# Patient Record
Sex: Female | Born: 1960
Health system: Southern US, Community
[De-identification: ages and names within clinical notes are randomized; demographics above are authoritative.]

## PROBLEM LIST (undated history)

## (undated) DIAGNOSIS — I10 Essential (primary) hypertension: Secondary | ICD-10-CM

## (undated) DIAGNOSIS — K589 Irritable bowel syndrome without diarrhea: Secondary | ICD-10-CM

## (undated) DIAGNOSIS — M19072 Primary osteoarthritis, left ankle and foot: Secondary | ICD-10-CM

## (undated) DIAGNOSIS — M810 Age-related osteoporosis without current pathological fracture: Secondary | ICD-10-CM

## (undated) DIAGNOSIS — R918 Other nonspecific abnormal finding of lung field: Secondary | ICD-10-CM

## (undated) DIAGNOSIS — K7689 Other specified diseases of liver: Secondary | ICD-10-CM

## (undated) DIAGNOSIS — G4700 Insomnia, unspecified: Secondary | ICD-10-CM

## (undated) DIAGNOSIS — D802 Selective deficiency of immunoglobulin A [IgA]: Secondary | ICD-10-CM

## (undated) DIAGNOSIS — M19041 Primary osteoarthritis, right hand: Secondary | ICD-10-CM

## (undated) DIAGNOSIS — I7789 Other specified disorders of arteries and arterioles: Secondary | ICD-10-CM

## (undated) DIAGNOSIS — E559 Vitamin D deficiency, unspecified: Secondary | ICD-10-CM

## (undated) DIAGNOSIS — M2240 Chondromalacia patellae, unspecified knee: Secondary | ICD-10-CM

## (undated) DIAGNOSIS — R5383 Other fatigue: Secondary | ICD-10-CM

## (undated) DIAGNOSIS — M19042 Primary osteoarthritis, left hand: Secondary | ICD-10-CM

## (undated) DIAGNOSIS — M797 Fibromyalgia: Principal | ICD-10-CM

## (undated) DIAGNOSIS — M170 Bilateral primary osteoarthritis of knee: Secondary | ICD-10-CM

## (undated) DIAGNOSIS — M19071 Primary osteoarthritis, right ankle and foot: Secondary | ICD-10-CM

## (undated) DIAGNOSIS — R600 Localized edema: Secondary | ICD-10-CM

## (undated) HISTORY — DX: Vitamin D deficiency, unspecified: E55.9

## (undated) HISTORY — DX: Age-related osteoporosis without current pathological fracture: M81.0

## (undated) HISTORY — DX: Selective deficiency of immunoglobulin a (iga): D80.2

## (undated) HISTORY — DX: Other specified diseases of liver: K76.89

## (undated) HISTORY — DX: Insomnia, unspecified: G47.00

## (undated) HISTORY — DX: Primary osteoarthritis, left ankle and foot: M19.072

## (undated) HISTORY — DX: Other specified disorders of arteries and arterioles: I77.89

## (undated) HISTORY — DX: Primary osteoarthritis, left hand: M19.042

## (undated) HISTORY — DX: Primary osteoarthritis, right hand: M19.041

## (undated) HISTORY — DX: Other nonspecific abnormal finding of lung field: R91.8

## (undated) HISTORY — DX: Chondromalacia patellae, unspecified knee: M22.40

## (undated) HISTORY — DX: Localized edema: R60.0

## (undated) HISTORY — DX: Primary osteoarthritis, right ankle and foot: M19.071

## (undated) HISTORY — DX: Other fatigue: R53.83

## (undated) HISTORY — DX: Essential (primary) hypertension: I10

## (undated) HISTORY — PX: ABDOMINAL HYSTERECTOMY: SHX81

## (undated) HISTORY — DX: Bilateral primary osteoarthritis of knee: M17.0

## (undated) HISTORY — PX: AUGMENTATION MAMMAPLASTY: SUR837

## (undated) HISTORY — DX: Fibromyalgia: M79.7

## (undated) HISTORY — DX: Irritable bowel syndrome without diarrhea: K58.9

---

## 2002-11-18 DIAGNOSIS — K219 Gastro-esophageal reflux disease without esophagitis: Secondary | ICD-10-CM | POA: Insufficient documentation

## 2002-11-18 DIAGNOSIS — N200 Calculus of kidney: Secondary | ICD-10-CM | POA: Insufficient documentation

## 2005-05-20 DIAGNOSIS — B351 Tinea unguium: Secondary | ICD-10-CM | POA: Insufficient documentation

## 2005-06-12 DIAGNOSIS — R259 Unspecified abnormal involuntary movements: Secondary | ICD-10-CM | POA: Insufficient documentation

## 2007-03-04 DIAGNOSIS — L723 Sebaceous cyst: Secondary | ICD-10-CM | POA: Insufficient documentation

## 2008-07-15 DIAGNOSIS — R042 Hemoptysis: Secondary | ICD-10-CM | POA: Insufficient documentation

## 2008-07-15 DIAGNOSIS — J189 Pneumonia, unspecified organism: Secondary | ICD-10-CM | POA: Insufficient documentation

## 2008-11-23 DIAGNOSIS — J309 Allergic rhinitis, unspecified: Secondary | ICD-10-CM | POA: Insufficient documentation

## 2014-03-21 ENCOUNTER — Other Ambulatory Visit (HOSPITAL_COMMUNITY): Payer: Self-pay | Admitting: Family Medicine

## 2014-03-22 ENCOUNTER — Other Ambulatory Visit (HOSPITAL_COMMUNITY): Payer: Self-pay | Admitting: Family Medicine

## 2014-03-30 ENCOUNTER — Other Ambulatory Visit (HOSPITAL_COMMUNITY): Payer: Self-pay | Admitting: Family Medicine

## 2014-03-30 DIAGNOSIS — I719 Aortic aneurysm of unspecified site, without rupture: Secondary | ICD-10-CM

## 2014-04-25 ENCOUNTER — Telehealth: Payer: Self-pay | Admitting: *Deleted

## 2014-04-25 NOTE — Telephone Encounter (Signed)
patient stated that her referring doctor decided she did not need to be seen at [facility] after she had her CT Scan, she will f/u in a year with Dr Badger//cm

## 2014-04-26 ENCOUNTER — Encounter: Payer: Self-pay | Admitting: Surgery

## 2014-08-17 DIAGNOSIS — R0600 Dyspnea, unspecified: Secondary | ICD-10-CM | POA: Insufficient documentation

## 2014-10-11 DIAGNOSIS — R059 Cough, unspecified: Secondary | ICD-10-CM | POA: Insufficient documentation

## 2014-10-11 DIAGNOSIS — R05 Cough: Secondary | ICD-10-CM | POA: Insufficient documentation

## 2015-09-13 DIAGNOSIS — R062 Wheezing: Secondary | ICD-10-CM | POA: Insufficient documentation

## 2016-02-06 DIAGNOSIS — Z86010 Personal history of colonic polyps: Secondary | ICD-10-CM | POA: Insufficient documentation

## 2016-02-06 DIAGNOSIS — Z860101 Personal history of adenomatous and serrated colon polyps: Secondary | ICD-10-CM | POA: Insufficient documentation

## 2016-02-07 HISTORY — PX: LUNG REMOVAL, PARTIAL: SHX233

## 2016-02-11 DIAGNOSIS — J479 Bronchiectasis, uncomplicated: Secondary | ICD-10-CM | POA: Insufficient documentation

## 2016-03-08 DIAGNOSIS — G8918 Other acute postprocedural pain: Secondary | ICD-10-CM | POA: Diagnosis not present

## 2016-03-09 DIAGNOSIS — G8918 Other acute postprocedural pain: Secondary | ICD-10-CM | POA: Diagnosis not present

## 2016-03-10 DIAGNOSIS — R911 Solitary pulmonary nodule: Secondary | ICD-10-CM | POA: Diagnosis not present

## 2016-05-06 DIAGNOSIS — M7542 Impingement syndrome of left shoulder: Secondary | ICD-10-CM | POA: Diagnosis not present

## 2016-05-06 DIAGNOSIS — M542 Cervicalgia: Secondary | ICD-10-CM | POA: Diagnosis not present

## 2016-06-08 HISTORY — PX: SHOULDER ARTHROSCOPY: SHX128

## 2016-06-10 DIAGNOSIS — M25512 Pain in left shoulder: Secondary | ICD-10-CM | POA: Diagnosis not present

## 2016-06-17 DIAGNOSIS — M25512 Pain in left shoulder: Secondary | ICD-10-CM | POA: Diagnosis not present

## 2016-06-24 DIAGNOSIS — Z23 Encounter for immunization: Secondary | ICD-10-CM | POA: Diagnosis not present

## 2016-06-26 DIAGNOSIS — J479 Bronchiectasis, uncomplicated: Secondary | ICD-10-CM | POA: Diagnosis not present

## 2016-06-26 DIAGNOSIS — I712 Thoracic aortic aneurysm, without rupture: Secondary | ICD-10-CM | POA: Diagnosis not present

## 2016-06-26 DIAGNOSIS — L989 Disorder of the skin and subcutaneous tissue, unspecified: Secondary | ICD-10-CM | POA: Diagnosis not present

## 2016-06-30 DIAGNOSIS — G8918 Other acute postprocedural pain: Secondary | ICD-10-CM | POA: Diagnosis not present

## 2016-06-30 DIAGNOSIS — M19012 Primary osteoarthritis, left shoulder: Secondary | ICD-10-CM | POA: Diagnosis not present

## 2016-06-30 DIAGNOSIS — M7502 Adhesive capsulitis of left shoulder: Secondary | ICD-10-CM | POA: Diagnosis not present

## 2016-06-30 DIAGNOSIS — M7542 Impingement syndrome of left shoulder: Secondary | ICD-10-CM | POA: Diagnosis not present

## 2016-06-30 DIAGNOSIS — M24112 Other articular cartilage disorders, left shoulder: Secondary | ICD-10-CM | POA: Diagnosis not present

## 2016-07-01 DIAGNOSIS — M25512 Pain in left shoulder: Secondary | ICD-10-CM | POA: Diagnosis not present

## 2016-07-01 DIAGNOSIS — M7502 Adhesive capsulitis of left shoulder: Secondary | ICD-10-CM | POA: Diagnosis not present

## 2016-07-01 DIAGNOSIS — M25612 Stiffness of left shoulder, not elsewhere classified: Secondary | ICD-10-CM | POA: Diagnosis not present

## 2016-07-02 DIAGNOSIS — M25512 Pain in left shoulder: Secondary | ICD-10-CM | POA: Diagnosis not present

## 2016-07-02 DIAGNOSIS — M25612 Stiffness of left shoulder, not elsewhere classified: Secondary | ICD-10-CM | POA: Diagnosis not present

## 2016-07-02 DIAGNOSIS — M7502 Adhesive capsulitis of left shoulder: Secondary | ICD-10-CM | POA: Diagnosis not present

## 2016-07-03 DIAGNOSIS — M25512 Pain in left shoulder: Secondary | ICD-10-CM | POA: Diagnosis not present

## 2016-07-03 DIAGNOSIS — M25612 Stiffness of left shoulder, not elsewhere classified: Secondary | ICD-10-CM | POA: Diagnosis not present

## 2016-07-03 DIAGNOSIS — M7502 Adhesive capsulitis of left shoulder: Secondary | ICD-10-CM | POA: Diagnosis not present

## 2016-07-04 DIAGNOSIS — M25612 Stiffness of left shoulder, not elsewhere classified: Secondary | ICD-10-CM | POA: Diagnosis not present

## 2016-07-04 DIAGNOSIS — M7502 Adhesive capsulitis of left shoulder: Secondary | ICD-10-CM | POA: Diagnosis not present

## 2016-07-04 DIAGNOSIS — M25512 Pain in left shoulder: Secondary | ICD-10-CM | POA: Diagnosis not present

## 2016-07-07 DIAGNOSIS — M25612 Stiffness of left shoulder, not elsewhere classified: Secondary | ICD-10-CM | POA: Diagnosis not present

## 2016-07-07 DIAGNOSIS — M25512 Pain in left shoulder: Secondary | ICD-10-CM | POA: Diagnosis not present

## 2016-07-07 DIAGNOSIS — M7502 Adhesive capsulitis of left shoulder: Secondary | ICD-10-CM | POA: Diagnosis not present

## 2016-07-08 DIAGNOSIS — M25512 Pain in left shoulder: Secondary | ICD-10-CM | POA: Diagnosis not present

## 2016-07-09 ENCOUNTER — Encounter: Payer: Self-pay | Admitting: *Deleted

## 2016-07-09 DIAGNOSIS — M19072 Primary osteoarthritis, left ankle and foot: Secondary | ICD-10-CM

## 2016-07-09 DIAGNOSIS — M2240 Chondromalacia patellae, unspecified knee: Secondary | ICD-10-CM

## 2016-07-09 DIAGNOSIS — M170 Bilateral primary osteoarthritis of knee: Secondary | ICD-10-CM

## 2016-07-09 DIAGNOSIS — M19071 Primary osteoarthritis, right ankle and foot: Secondary | ICD-10-CM

## 2016-07-09 DIAGNOSIS — R918 Other nonspecific abnormal finding of lung field: Secondary | ICD-10-CM

## 2016-07-09 DIAGNOSIS — M7061 Trochanteric bursitis, right hip: Secondary | ICD-10-CM | POA: Insufficient documentation

## 2016-07-09 DIAGNOSIS — D802 Selective deficiency of immunoglobulin A [IgA]: Secondary | ICD-10-CM

## 2016-07-09 DIAGNOSIS — M19041 Primary osteoarthritis, right hand: Secondary | ICD-10-CM

## 2016-07-09 DIAGNOSIS — M25612 Stiffness of left shoulder, not elsewhere classified: Secondary | ICD-10-CM | POA: Diagnosis not present

## 2016-07-09 DIAGNOSIS — G4700 Insomnia, unspecified: Secondary | ICD-10-CM

## 2016-07-09 DIAGNOSIS — R600 Localized edema: Secondary | ICD-10-CM

## 2016-07-09 DIAGNOSIS — E559 Vitamin D deficiency, unspecified: Secondary | ICD-10-CM

## 2016-07-09 DIAGNOSIS — M797 Fibromyalgia: Secondary | ICD-10-CM

## 2016-07-09 DIAGNOSIS — K7689 Other specified diseases of liver: Secondary | ICD-10-CM | POA: Insufficient documentation

## 2016-07-09 DIAGNOSIS — R5383 Other fatigue: Secondary | ICD-10-CM

## 2016-07-09 DIAGNOSIS — M7062 Trochanteric bursitis, left hip: Secondary | ICD-10-CM

## 2016-07-09 DIAGNOSIS — M25512 Pain in left shoulder: Secondary | ICD-10-CM | POA: Diagnosis not present

## 2016-07-09 DIAGNOSIS — M19042 Primary osteoarthritis, left hand: Secondary | ICD-10-CM

## 2016-07-09 DIAGNOSIS — M7502 Adhesive capsulitis of left shoulder: Secondary | ICD-10-CM | POA: Diagnosis not present

## 2016-07-09 DIAGNOSIS — K589 Irritable bowel syndrome without diarrhea: Secondary | ICD-10-CM

## 2016-07-09 HISTORY — DX: Insomnia, unspecified: G47.00

## 2016-07-09 HISTORY — DX: Fibromyalgia: M79.7

## 2016-07-09 HISTORY — DX: Bilateral primary osteoarthritis of knee: M17.0

## 2016-07-09 HISTORY — DX: Irritable bowel syndrome, unspecified: K58.9

## 2016-07-09 HISTORY — DX: Other fatigue: R53.83

## 2016-07-09 HISTORY — DX: Other nonspecific abnormal finding of lung field: R91.8

## 2016-07-09 HISTORY — DX: Vitamin D deficiency, unspecified: E55.9

## 2016-07-09 HISTORY — DX: Other specified diseases of liver: K76.89

## 2016-07-09 HISTORY — DX: Localized edema: R60.0

## 2016-07-09 HISTORY — DX: Selective deficiency of immunoglobulin a (iga): D80.2

## 2016-07-09 HISTORY — DX: Primary osteoarthritis, right ankle and foot: M19.071

## 2016-07-09 HISTORY — DX: Primary osteoarthritis, left hand: M19.042

## 2016-07-09 HISTORY — DX: Chondromalacia patellae, unspecified knee: M22.40

## 2016-07-09 HISTORY — DX: Primary osteoarthritis, right hand: M19.041

## 2016-07-09 NOTE — Progress Notes (Signed)
*IMAGE* Office Visit Note  Patient: Joann Smith             Date of Birth: 1961-05-01           MRN: [id number]             PCP: Eartha Inch, MD Referring: Eartha Inch, MD Visit Date: 07/10/2016 Occupation:@GUAROCC @    Subjective:  Follow-up Follow-up on fibromyalgia syndrome fatigue and insomnia.  History of Present Illness: Bernard Donahoo is a 55 y.o. female last seen 01/08/2016 for fibromyalgia. Patient states that he was quite active and she rated her pain as 5-7 on a scale of 0-10 with 18 out of 18 tender points. Her fatigue was also aggravating her and she rated that pain to 6-7 on a scale of 0-10. However she was getting good sleep despite having episodes of insomnia from time to time. Flexeril was helping patient's sleep well. She was also having bilateral greater trochanter pain and bilateral SI joint pain. I offered a cortisone injection to 2 of these sites and patient declined. She instead opted for seeing Dr. Jonny Ruiz O'Halloran physical therapist if she needed to. We also discussed using over-the-counter melatonin to help with sleep. We also discussed using Robaxin in the future if daytime muscle relaxer as needed.  Today the patient states well overall except she did have the left shoulder surgery which is exacerbating some of her fibromyalgia. She is on Percocet prescribed by her physician/surgeon. She is planning on tapering off of this medication. Her fibromyalgia pain is rated about 4-7 on a scale of 0-10 range. Patient rates her fatigue is a 5 and again the left shoulder joint pain is contributing to her poor sleep.   Patient recently had shoulder surgery to the left shoulder about 10 days ago. The pain from the surgery and recovery is affecting her sleep. This is understandable. And patient is aware that her fiber can flare during this period.  Patient also states that about 3 months ago at [facility] they removed her right lower lobe of her lung because of ongoing  chronic Bronchiectasis. She was getting sick on a repeated blood basis and the encourage her to get the surgery so that way they can address that. Patient is currently doing well.   Activities of Daily Living:  Patient reports morning stiffness for 15 minutes.   Patient Reports nocturnal pain.  Difficulty dressing/grooming: Reports Difficulty climbing stairs: Reports Difficulty getting out of chair: Reports Difficulty using hands for taps, buttons, cutlery, and/or writing: Reports   Review of Systems  Constitutional: Negative for fatigue.  HENT: Negative for mouth sores and mouth dryness.   Eyes: Negative for dryness.  Respiratory: Negative for shortness of breath.   Gastrointestinal: Negative for constipation and diarrhea.  Musculoskeletal: Negative for myalgias and myalgias.  Skin: Negative for sensitivity to sunlight.  Psychiatric/Behavioral: Negative for decreased concentration and sleep disturbance.    PMFS History:  Patient Active Problem List   Diagnosis Date Noted  . Fibromyalgia 07/09/2016  . Fatigue 07/09/2016  . Insomnia 07/09/2016  . Osteoarthritis of both hands 07/09/2016  . Osteoarthritis of both feet 07/09/2016  . Osteoarthritis of both knees 07/09/2016  . Chondromalacia, patella 07/09/2016  . Vitamin D deficiency 07/09/2016  . Pulmonary mass 07/09/2016  . IBS (irritable bowel syndrome) 07/09/2016  . IgA deficiency (HCC) 07/09/2016  . Liver nodule 07/09/2016  . Pedal edema 07/09/2016  . Bilateral sacroiliitis (HCC) 07/09/2016  . Greater trochanteric bursitis of both hips 07/09/2016  Past Medical History:  Diagnosis Date  . Chondromalacia, patella 07/09/2016   Moderate  . Enlarged aorta (HCC)   . Fatigue 07/09/2016  . Fibromyalgia 07/09/2016  . IBS (irritable bowel syndrome) 07/09/2016  . IgA deficiency (HCC) 07/09/2016  . Insomnia 07/09/2016  . Liver nodule 07/09/2016  . Osteoarthritis of both feet 07/09/2016  . Osteoarthritis of both hands 07/09/2016    Mild  . Osteoarthritis of both knees 07/09/2016   Moderate  . Pedal edema 07/09/2016  . Pulmonary mass 07/09/2016   F/u with pulmonologist yearly   . Vitamin D deficiency 07/09/2016    Family History  Problem Relation Age of Onset  . Dementia Mother   . Cancer Father    Past Surgical History:  Procedure Laterality Date  . ABDOMINAL HYSTERECTOMY    . LUNG REMOVAL, PARTIAL Right 02/2016  . SHOULDER ARTHROSCOPY Left 06/2016   Social History   Social History Narrative  . No narrative on file     Objective: Vital Signs: BP 102/70 (BP Location: Right Arm, Patient Position: Sitting, Cuff Size: Large)   Pulse 74   Resp 12   Ht 6' (1.829 m)   Wt 164 lb (74.4 kg)   LMP 07/10/2010   BMI 22.24 kg/m    Physical Exam  Constitutional: She is oriented to person, place, and time. She appears well-developed and well-nourished.  HENT:  Head: Normocephalic and atraumatic.  Eyes: EOM are normal. Pupils are equal, round, and reactive to light.  Cardiovascular: Normal rate, regular rhythm and normal heart sounds.  Exam reveals no gallop and no friction rub.   No murmur heard. Pulmonary/Chest: Effort normal and breath sounds normal. She has no wheezes. She has no rales.  Abdominal: Soft. Bowel sounds are normal. She exhibits no distension. There is no tenderness. There is no guarding. No hernia.  Musculoskeletal: Normal range of motion. She exhibits no edema, tenderness or deformity.  Lymphadenopathy:    She has no cervical adenopathy.  Neurological: She is alert and oriented to person, place, and time. Coordination normal.  Skin: Skin is warm and dry. Capillary refill takes less than 2 seconds. No rash noted.  Psychiatric: She has a normal mood and affect. Her behavior is normal.  Nursing note and vitals reviewed.    Musculoskeletal Exam:  Full range of motion of all joints except decreased range of motion of left shoulder joint. She just had recent surgery 10 days ago from Dr. Mckinley Jewelaniel  Murphy. Grip strength is equal and strong bilaterally Fibromyalgia tender points are 18/18. CDAI Exam: CDAI Homunculus Exam:   Joint Counts:  CDAI Tender Joint count: 0 CDAI Swollen Joint count: 0  Global Assessments:  Patient Global Assessment: 7 Provider Global Assessment: 7  CDAI Calculated Score: 14    Investigation: Findings:   On 06/07/2014 patient CCP was negative, ANA negative, rheumatoid factor negative.   Imaging: No results found.  Speciality Comments: No specialty comments available.    Procedures:  No procedures performed Allergies: Augmentin [amoxicillin-pot clavulanate]; Cymbalta [duloxetine hcl]; Sulfa antibiotics; and Omeprazole   Assessment / Plan: Visit Diagnoses: Fibromyalgia  Fatigue, unspecified type  Insomnia, unspecified type  Bilateral sacroiliitis (HCC)  Greater trochanteric bursitis of both hips   I'm offering the patient a refill on Flexeril 7.5mg  and will give her Robaxin bid.  Order CBC with differential CMP with GFR as a baseline. Since there are no CBC with differential and CMP with GFR in the patient's chart. Patient states that since she had recent surgery done  on her left shoulder a few days ago through Dr. Reuel Boomaniel Murphy's office she remembers having blood drawn. I suspect that she will have CBC and CMP on file and she'll get us a hard copy of that so we can posted into the computer. It would be wasteful to draw blood again on the patient in light of this fact.  I reviewed the patient's allergies which consist of sulfa, Augmentin, Cymbalta.  Orders: No orders of the defined types were placed in this encounter.  Meds ordered this encounter  Medications  . FIBER PO    Sig: Take by mouth.  . fluticasone (FLONASE) 50 MCG/ACT nasal spray    Sig: Place into the nose.  . ibuprofen (ADVIL,MOTRIN) 200 MG tablet    Sig: Take by mouth.  . Lactase (LACTOSE FAST ACTING RELIEF) 9000 units CHEW    Sig: Chew by mouth.  .  oxyCODONE-acetaminophen (PERCOCET/ROXICET) 5-325 MG tablet    Sig: TAKE 1 OR 2 TABLETS BY MOUTH EVERY 4 HOURS AS NEEDED FOR PAIN    Refill:  0  . polyethylene glycol (MIRALAX / GLYCOLAX) packet    Sig: Take by mouth.  Marland Kitchen. albuterol (PROVENTIL) (2.5 MG/3ML) 0.083% nebulizer solution    Sig: Inhale into the lungs.    Face-to-face time spent with patient was 30 minutes. 50% of time was spent in counseling and coordination of care.  Follow-Up Instructions: Return in about 6 months (around 01/07/2017) for Fibromyalga, fatigue, insomnia, .

## 2016-07-10 ENCOUNTER — Encounter: Payer: Self-pay | Admitting: Rheumatology

## 2016-07-10 ENCOUNTER — Ambulatory Visit (INDEPENDENT_AMBULATORY_CARE_PROVIDER_SITE_OTHER): Payer: BLUE CROSS/BLUE SHIELD | Admitting: Rheumatology

## 2016-07-10 VITALS — BP 102/70 | HR 74 | Resp 12 | Ht 72.0 in | Wt 164.0 lb

## 2016-07-10 DIAGNOSIS — R5383 Other fatigue: Secondary | ICD-10-CM

## 2016-07-10 DIAGNOSIS — M7061 Trochanteric bursitis, right hip: Secondary | ICD-10-CM | POA: Diagnosis not present

## 2016-07-10 DIAGNOSIS — M461 Sacroiliitis, not elsewhere classified: Secondary | ICD-10-CM

## 2016-07-10 DIAGNOSIS — M797 Fibromyalgia: Secondary | ICD-10-CM | POA: Diagnosis not present

## 2016-07-10 DIAGNOSIS — G4700 Insomnia, unspecified: Secondary | ICD-10-CM | POA: Diagnosis not present

## 2016-07-10 DIAGNOSIS — M7062 Trochanteric bursitis, left hip: Secondary | ICD-10-CM | POA: Diagnosis not present

## 2016-07-10 MED ORDER — CYCLOBENZAPRINE HCL 10 MG PO TABS: ORAL_TABLET | ORAL | 5 refills | Status: DC

## 2016-07-10 MED ORDER — METHOCARBAMOL 500 MG PO TABS: 500.0000 mg | ORAL_TABLET | Freq: Two times a day (BID) | ORAL | 3 refills | Status: DC | PRN

## 2016-07-10 NOTE — Patient Instructions (Signed)
Iliotibial Band Syndrome  Iliotibial band syndrome is pain in the outer, lower thigh. The pain is caused by an inflammation of the iliotibial band. This is a band of thick fibrous tissue that runs down the outside of the thigh. The iliotibial band begins at the hip. It extends to the outer side of the shin bone (tibia) just below the knee joint. The band works with the thigh muscles. Together they provide stability to the outside of the knee joint.  Iliotibial band syndrome occurs when there is inflammation to this band of tissue. This is typically due to over use and not due to an injury. The irritation usually occurs over the outside of the knee joint, at the the end of the thigh bone (femur). The iliotibial band crosses bone and muscle at this point. Between these structures is a cushioning sac (bursa). The bursa should make possible a smooth gliding motion. However, when inflamed, the iliotibial band does not glide easily. When inflamed, there is pain with motion of the knee. Usually the pain worsens with continued movement and the pain goes away with rest.  This problem usually arises when there is a sudden increase in sports activities involving your legs. Running, and playing soccer or basketball are examples of activities causing this. Others who are prone to iliotibial band syndrome include individuals with mechanical problems such as leg length differences, abnormality of walking, bowed legs etc.  HOME CARE INSTRUCTIONS   · Apply ice to the injured area:    Put ice in a plastic bag.    Place a towel between your skin and the bag.    Leave the ice on for 20 minutes, 2-3 times a day.  · Limit excessive training or eliminate training until pain goes away.  · While pain is present, you may use gentle range of motion. Do not resume regular use until instructed by your health care provider. Begin use gradually. Do not increase activity to the point of pain. If pain does develop, decrease activity and continue  the above measures. Gradually increase activities that do not cause discomfort. Do this until you finally achieve normal use.  · Perform low-impact activities while pain is present. Wear proper footwear.  · Only take over-the-counter or prescription medicines for pain, discomfort, or fever as directed by your health care provider.  SEEK MEDICAL CARE IF:   · Your pain increases or pain is not controlled with medications.  · You develop new, unexplained symptoms, or an increase of the symptoms that brought you to your health care provider.  · Your pain and symptoms are not improving or are getting worse.     This information is not intended to replace advice given to you by your health care provider. Make sure you discuss any questions you have with your health care provider.     Document Released: 02/14/2002 Document Revised: 09/15/2014 Document Reviewed: 03/24/2013  Elsevier Interactive Patient Education ©2016 [employer]

## 2016-07-11 DIAGNOSIS — M7502 Adhesive capsulitis of left shoulder: Secondary | ICD-10-CM | POA: Diagnosis not present

## 2016-07-11 DIAGNOSIS — M25612 Stiffness of left shoulder, not elsewhere classified: Secondary | ICD-10-CM | POA: Diagnosis not present

## 2016-07-11 DIAGNOSIS — M25512 Pain in left shoulder: Secondary | ICD-10-CM | POA: Diagnosis not present

## 2016-07-14 DIAGNOSIS — M7502 Adhesive capsulitis of left shoulder: Secondary | ICD-10-CM | POA: Diagnosis not present

## 2016-07-14 DIAGNOSIS — M25612 Stiffness of left shoulder, not elsewhere classified: Secondary | ICD-10-CM | POA: Diagnosis not present

## 2016-07-14 DIAGNOSIS — M25512 Pain in left shoulder: Secondary | ICD-10-CM | POA: Diagnosis not present

## 2016-07-16 DIAGNOSIS — M25512 Pain in left shoulder: Secondary | ICD-10-CM | POA: Diagnosis not present

## 2016-07-16 DIAGNOSIS — M7502 Adhesive capsulitis of left shoulder: Secondary | ICD-10-CM | POA: Diagnosis not present

## 2016-07-16 DIAGNOSIS — H524 Presbyopia: Secondary | ICD-10-CM | POA: Diagnosis not present

## 2016-07-16 DIAGNOSIS — H11001 Unspecified pterygium of right eye: Secondary | ICD-10-CM | POA: Diagnosis not present

## 2016-07-16 DIAGNOSIS — H5203 Hypermetropia, bilateral: Secondary | ICD-10-CM | POA: Diagnosis not present

## 2016-07-16 DIAGNOSIS — M25612 Stiffness of left shoulder, not elsewhere classified: Secondary | ICD-10-CM | POA: Diagnosis not present

## 2016-07-17 DIAGNOSIS — Z124 Encounter for screening for malignant neoplasm of cervix: Secondary | ICD-10-CM | POA: Diagnosis not present

## 2016-07-17 DIAGNOSIS — Z6822 Body mass index (BMI) 22.0-22.9, adult: Secondary | ICD-10-CM | POA: Diagnosis not present

## 2016-07-17 DIAGNOSIS — Z01419 Encounter for gynecological examination (general) (routine) without abnormal findings: Secondary | ICD-10-CM | POA: Diagnosis not present

## 2016-07-18 DIAGNOSIS — M7502 Adhesive capsulitis of left shoulder: Secondary | ICD-10-CM | POA: Diagnosis not present

## 2016-07-18 DIAGNOSIS — M25612 Stiffness of left shoulder, not elsewhere classified: Secondary | ICD-10-CM | POA: Diagnosis not present

## 2016-07-18 DIAGNOSIS — M25512 Pain in left shoulder: Secondary | ICD-10-CM | POA: Diagnosis not present

## 2016-07-21 DIAGNOSIS — D224 Melanocytic nevi of scalp and neck: Secondary | ICD-10-CM | POA: Diagnosis not present

## 2016-07-21 DIAGNOSIS — L821 Other seborrheic keratosis: Secondary | ICD-10-CM | POA: Diagnosis not present

## 2016-07-21 DIAGNOSIS — D2271 Melanocytic nevi of right lower limb, including hip: Secondary | ICD-10-CM | POA: Diagnosis not present

## 2016-07-21 DIAGNOSIS — L738 Other specified follicular disorders: Secondary | ICD-10-CM | POA: Diagnosis not present

## 2016-07-21 DIAGNOSIS — D2261 Melanocytic nevi of right upper limb, including shoulder: Secondary | ICD-10-CM | POA: Diagnosis not present

## 2016-07-21 DIAGNOSIS — L820 Inflamed seborrheic keratosis: Secondary | ICD-10-CM | POA: Diagnosis not present

## 2016-07-22 DIAGNOSIS — M25612 Stiffness of left shoulder, not elsewhere classified: Secondary | ICD-10-CM | POA: Diagnosis not present

## 2016-07-22 DIAGNOSIS — M7502 Adhesive capsulitis of left shoulder: Secondary | ICD-10-CM | POA: Diagnosis not present

## 2016-07-22 DIAGNOSIS — M25512 Pain in left shoulder: Secondary | ICD-10-CM | POA: Diagnosis not present

## 2016-07-24 DIAGNOSIS — M25512 Pain in left shoulder: Secondary | ICD-10-CM | POA: Diagnosis not present

## 2016-07-24 DIAGNOSIS — M7502 Adhesive capsulitis of left shoulder: Secondary | ICD-10-CM | POA: Diagnosis not present

## 2016-07-24 DIAGNOSIS — M25612 Stiffness of left shoulder, not elsewhere classified: Secondary | ICD-10-CM | POA: Diagnosis not present

## 2016-07-28 DIAGNOSIS — M25612 Stiffness of left shoulder, not elsewhere classified: Secondary | ICD-10-CM | POA: Diagnosis not present

## 2016-07-28 DIAGNOSIS — M7502 Adhesive capsulitis of left shoulder: Secondary | ICD-10-CM | POA: Diagnosis not present

## 2016-07-28 DIAGNOSIS — M25512 Pain in left shoulder: Secondary | ICD-10-CM | POA: Diagnosis not present

## 2016-07-30 DIAGNOSIS — M7502 Adhesive capsulitis of left shoulder: Secondary | ICD-10-CM | POA: Diagnosis not present

## 2016-07-30 DIAGNOSIS — M25512 Pain in left shoulder: Secondary | ICD-10-CM | POA: Diagnosis not present

## 2016-07-30 DIAGNOSIS — M25612 Stiffness of left shoulder, not elsewhere classified: Secondary | ICD-10-CM | POA: Diagnosis not present

## 2016-08-04 DIAGNOSIS — M25612 Stiffness of left shoulder, not elsewhere classified: Secondary | ICD-10-CM | POA: Diagnosis not present

## 2016-08-04 DIAGNOSIS — M7502 Adhesive capsulitis of left shoulder: Secondary | ICD-10-CM | POA: Diagnosis not present

## 2016-08-04 DIAGNOSIS — M25512 Pain in left shoulder: Secondary | ICD-10-CM | POA: Diagnosis not present

## 2016-08-07 DIAGNOSIS — M25512 Pain in left shoulder: Secondary | ICD-10-CM | POA: Diagnosis not present

## 2016-08-07 DIAGNOSIS — M7502 Adhesive capsulitis of left shoulder: Secondary | ICD-10-CM | POA: Diagnosis not present

## 2016-08-07 DIAGNOSIS — M25612 Stiffness of left shoulder, not elsewhere classified: Secondary | ICD-10-CM | POA: Diagnosis not present

## 2016-08-11 DIAGNOSIS — M7502 Adhesive capsulitis of left shoulder: Secondary | ICD-10-CM | POA: Diagnosis not present

## 2016-08-11 DIAGNOSIS — M25612 Stiffness of left shoulder, not elsewhere classified: Secondary | ICD-10-CM | POA: Diagnosis not present

## 2016-08-11 DIAGNOSIS — M25512 Pain in left shoulder: Secondary | ICD-10-CM | POA: Diagnosis not present

## 2016-08-12 DIAGNOSIS — M7502 Adhesive capsulitis of left shoulder: Secondary | ICD-10-CM | POA: Diagnosis not present

## 2016-08-15 DIAGNOSIS — Z6822 Body mass index (BMI) 22.0-22.9, adult: Secondary | ICD-10-CM | POA: Diagnosis not present

## 2016-08-15 DIAGNOSIS — M7502 Adhesive capsulitis of left shoulder: Secondary | ICD-10-CM | POA: Diagnosis not present

## 2016-08-15 DIAGNOSIS — M25512 Pain in left shoulder: Secondary | ICD-10-CM | POA: Diagnosis not present

## 2016-08-15 DIAGNOSIS — Z1231 Encounter for screening mammogram for malignant neoplasm of breast: Secondary | ICD-10-CM | POA: Diagnosis not present

## 2016-08-15 DIAGNOSIS — M25612 Stiffness of left shoulder, not elsewhere classified: Secondary | ICD-10-CM | POA: Diagnosis not present

## 2016-08-18 DIAGNOSIS — M7502 Adhesive capsulitis of left shoulder: Secondary | ICD-10-CM | POA: Diagnosis not present

## 2016-08-18 DIAGNOSIS — M25612 Stiffness of left shoulder, not elsewhere classified: Secondary | ICD-10-CM | POA: Diagnosis not present

## 2016-08-18 DIAGNOSIS — M25512 Pain in left shoulder: Secondary | ICD-10-CM | POA: Diagnosis not present

## 2016-08-19 ENCOUNTER — Other Ambulatory Visit: Payer: Self-pay | Admitting: Obstetrics

## 2016-08-19 DIAGNOSIS — R928 Other abnormal and inconclusive findings on diagnostic imaging of breast: Secondary | ICD-10-CM

## 2016-08-21 DIAGNOSIS — M25612 Stiffness of left shoulder, not elsewhere classified: Secondary | ICD-10-CM | POA: Diagnosis not present

## 2016-08-21 DIAGNOSIS — M25512 Pain in left shoulder: Secondary | ICD-10-CM | POA: Diagnosis not present

## 2016-08-21 DIAGNOSIS — M7502 Adhesive capsulitis of left shoulder: Secondary | ICD-10-CM | POA: Diagnosis not present

## 2016-08-25 DIAGNOSIS — M25612 Stiffness of left shoulder, not elsewhere classified: Secondary | ICD-10-CM | POA: Diagnosis not present

## 2016-08-25 DIAGNOSIS — M7502 Adhesive capsulitis of left shoulder: Secondary | ICD-10-CM | POA: Diagnosis not present

## 2016-08-25 DIAGNOSIS — M25512 Pain in left shoulder: Secondary | ICD-10-CM | POA: Diagnosis not present

## 2016-08-27 ENCOUNTER — Ambulatory Visit
Admission: RE | Admit: 2016-08-27 | Discharge: 2016-08-27 | Disposition: A | Discharge status: Home / Self Care | Payer: BLUE CROSS/BLUE SHIELD | Source: Ambulatory Visit | Attending: Obstetrics | Admitting: Obstetrics

## 2016-08-27 DIAGNOSIS — N6322 Unspecified lump in the left breast, upper inner quadrant: Secondary | ICD-10-CM | POA: Diagnosis not present

## 2016-08-27 DIAGNOSIS — N6002 Solitary cyst of left breast: Secondary | ICD-10-CM | POA: Diagnosis not present

## 2016-08-27 DIAGNOSIS — R928 Other abnormal and inconclusive findings on diagnostic imaging of breast: Secondary | ICD-10-CM

## 2016-09-09 DIAGNOSIS — M25612 Stiffness of left shoulder, not elsewhere classified: Secondary | ICD-10-CM | POA: Diagnosis not present

## 2016-09-09 DIAGNOSIS — M25512 Pain in left shoulder: Secondary | ICD-10-CM | POA: Diagnosis not present

## 2016-09-09 DIAGNOSIS — M7502 Adhesive capsulitis of left shoulder: Secondary | ICD-10-CM | POA: Diagnosis not present

## 2016-09-16 DIAGNOSIS — M7502 Adhesive capsulitis of left shoulder: Secondary | ICD-10-CM | POA: Diagnosis not present

## 2016-09-16 DIAGNOSIS — M25612 Stiffness of left shoulder, not elsewhere classified: Secondary | ICD-10-CM | POA: Diagnosis not present

## 2016-09-16 DIAGNOSIS — M25512 Pain in left shoulder: Secondary | ICD-10-CM | POA: Diagnosis not present

## 2016-09-23 DIAGNOSIS — M25512 Pain in left shoulder: Secondary | ICD-10-CM | POA: Diagnosis not present

## 2016-10-20 DIAGNOSIS — J479 Bronchiectasis, uncomplicated: Secondary | ICD-10-CM | POA: Diagnosis not present

## 2016-10-22 DIAGNOSIS — M545 Low back pain: Secondary | ICD-10-CM | POA: Diagnosis not present

## 2016-10-27 DIAGNOSIS — M545 Low back pain: Secondary | ICD-10-CM | POA: Diagnosis not present

## 2016-10-27 DIAGNOSIS — M791 Myalgia: Secondary | ICD-10-CM | POA: Diagnosis not present

## 2016-11-04 DIAGNOSIS — M25512 Pain in left shoulder: Secondary | ICD-10-CM | POA: Diagnosis not present

## 2016-11-05 DIAGNOSIS — N644 Mastodynia: Secondary | ICD-10-CM | POA: Diagnosis not present

## 2016-11-05 DIAGNOSIS — Z6822 Body mass index (BMI) 22.0-22.9, adult: Secondary | ICD-10-CM | POA: Diagnosis not present

## 2016-11-07 ENCOUNTER — Other Ambulatory Visit: Payer: Self-pay | Admitting: Obstetrics

## 2016-11-07 DIAGNOSIS — N644 Mastodynia: Secondary | ICD-10-CM

## 2016-11-11 ENCOUNTER — Ambulatory Visit
Admission: RE | Admit: 2016-11-11 | Discharge: 2016-11-11 | Disposition: A | Discharge status: Home / Self Care | Payer: BLUE CROSS/BLUE SHIELD | Source: Ambulatory Visit | Attending: Obstetrics | Admitting: Obstetrics

## 2016-11-11 DIAGNOSIS — N644 Mastodynia: Secondary | ICD-10-CM

## 2016-11-11 DIAGNOSIS — R922 Inconclusive mammogram: Secondary | ICD-10-CM | POA: Diagnosis not present

## 2016-11-12 DIAGNOSIS — M545 Low back pain: Secondary | ICD-10-CM | POA: Diagnosis not present

## 2016-11-12 DIAGNOSIS — M5136 Other intervertebral disc degeneration, lumbar region: Secondary | ICD-10-CM | POA: Diagnosis not present

## 2016-11-12 DIAGNOSIS — M461 Sacroiliitis, not elsewhere classified: Secondary | ICD-10-CM | POA: Diagnosis not present

## 2016-11-14 DIAGNOSIS — M461 Sacroiliitis, not elsewhere classified: Secondary | ICD-10-CM | POA: Diagnosis not present

## 2016-11-18 DIAGNOSIS — Z0000 Encounter for general adult medical examination without abnormal findings: Secondary | ICD-10-CM | POA: Diagnosis not present

## 2016-11-18 DIAGNOSIS — Z13220 Encounter for screening for lipoid disorders: Secondary | ICD-10-CM | POA: Diagnosis not present

## 2016-11-18 DIAGNOSIS — Z23 Encounter for immunization: Secondary | ICD-10-CM | POA: Diagnosis not present

## 2016-12-09 DIAGNOSIS — M545 Low back pain: Secondary | ICD-10-CM | POA: Diagnosis not present

## 2016-12-09 DIAGNOSIS — M461 Sacroiliitis, not elsewhere classified: Secondary | ICD-10-CM | POA: Diagnosis not present

## 2017-01-05 DIAGNOSIS — M47816 Spondylosis without myelopathy or radiculopathy, lumbar region: Secondary | ICD-10-CM | POA: Diagnosis not present

## 2017-01-07 ENCOUNTER — Ambulatory Visit: Payer: BLUE CROSS/BLUE SHIELD | Admitting: Rheumatology

## 2017-01-14 NOTE — Progress Notes (Signed)
Office Visit Note  Patient: Joann Smith             Date of Birth: 1960-11-10           MRN: [id number]             PCP: Lawerance Cruel, MD Referring: Chesley Noon, MD Visit Date: 01/20/2017 Occupation: '@GUAROCC'$ @    Subjective:  Lower back pain  History of Present Illness: Joann Smith is a 56 y.o. female with history of fibromyalgia osteoarthritis and lower back pain. She states she's been having generalized pain from fibromyalgia. She also continues to have discomfort in her lower back. She's been seeing a physician at [facility] and had steroid injections to her lower back. The injections last only for a week. She has tried physical therapy without much results. She rates her pain at 5 on the scale of 0-10 and fatigue at 4.  Activities of Daily Living:  Patient reports morning stiffness for 1 hour.   Patient Denies nocturnal pain.  Difficulty dressing/grooming: Denies Difficulty climbing stairs: Denies Difficulty getting out of chair: Denies Difficulty using hands for taps, buttons, cutlery, and/or writing: Denies   Review of Systems  Constitutional: Negative for fatigue, night sweats, weight gain, weight loss and weakness.  HENT: Negative for mouth sores, mouth dryness and nose dryness.   Eyes: Negative for redness, visual disturbance and dryness.  Respiratory: Positive for shortness of breath (With exertion ). Negative for cough and difficulty breathing.   Cardiovascular: Negative for chest pain, palpitations, hypertension, irregular heartbeat and swelling in legs/feet.  Gastrointestinal: Negative for blood in stool, constipation and diarrhea.  Endocrine: Negative for increased urination.  Genitourinary: Negative for painful urination.  Musculoskeletal: Positive for arthralgias, joint pain, myalgias, morning stiffness, muscle tenderness and myalgias. Negative for joint swelling and muscle weakness.  Skin: Negative for color change, rash, hair loss,  nodules/bumps, skin tightness, ulcers and sensitivity to sunlight.  Allergic/Immunologic: Negative for susceptible to infections.  Neurological: Positive for headaches. Negative for dizziness, memory loss and night sweats.  Hematological: Negative for swollen glands.  Psychiatric/Behavioral: Positive for sleep disturbance. Negative for depressed mood. The patient is not nervous/anxious.     PMFS History:  Patient Active Problem List   Diagnosis Date Noted  . Fibromyalgia 07/09/2016  . Fatigue 07/09/2016  . Insomnia 07/09/2016  . Osteoarthritis of both hands 07/09/2016  . Osteoarthritis of both feet 07/09/2016  . Osteoarthritis of both knees 07/09/2016  . Chondromalacia, patella 07/09/2016  . Vitamin D deficiency 07/09/2016  . Pulmonary mass 07/09/2016  . IBS (irritable bowel syndrome) 07/09/2016  . IgA deficiency ([place]) 07/09/2016  . Liver nodule 07/09/2016  . Pedal edema 07/09/2016  . Greater trochanteric bursitis of both hips 07/09/2016    Past Medical History:  Diagnosis Date  . Chondromalacia, patella 07/09/2016   Moderate  . Enlarged aorta ([place])   . Fatigue 07/09/2016  . Fibromyalgia 07/09/2016  . IBS (irritable bowel syndrome) 07/09/2016  . IgA deficiency ([place]) 07/09/2016  . Insomnia 07/09/2016  . Liver nodule 07/09/2016  . Osteoarthritis of both feet 07/09/2016  . Osteoarthritis of both hands 07/09/2016   Mild  . Osteoarthritis of both knees 07/09/2016   Moderate  . Pedal edema 07/09/2016  . Pulmonary mass 07/09/2016   F/u with pulmonologist yearly   . Vitamin D deficiency 07/09/2016    Family History  Problem Relation Age of Onset  . Dementia Mother   . Cancer Father    Past Surgical History:  Procedure  Laterality Date  . ABDOMINAL HYSTERECTOMY    . AUGMENTATION MAMMAPLASTY Bilateral   . LUNG REMOVAL, PARTIAL Right 02/2016  . SHOULDER ARTHROSCOPY Left 06/2016   Social History   Social History Narrative  . No narrative on file     Objective: Vital Signs: BP  104/64   Pulse 78   Resp 16   Ht 6' (1.829 m)   Wt 161 lb (73 kg)   LMP 07/10/2010   BMI 21.84 kg/m    Physical Exam   Musculoskeletal Exam: C-spine and thoracic lumbar spine good range of motion. She is some tenderness over right SI joint and right piriformis area. Shoulder joints elbow joints wrist joint MCPs PIPs DIPs with good range of motion. She has some DIP PIP thickening bilaterally. Hip joints knee joints ankles MTPs PIPs are good range of motion with no synovitis. She has tenderness over bilateral trochanteric area. Fibromyalgia tender points only 8 out of 18 positive.  CDAI Exam: No CDAI exam completed.    Investigation: Findings:   Labs from June 07, 2014 shows CMP with GFR normal, CBC with diff is normal, sed rate is normal, uric acid is normal, CK is normal at 79, ACE is normal at 40, TSH is normal at 2.095, rheumatoid factor negative, ANA negative, M-spike negative, CCP negative, HLA-B27 negative, vitamin D normal at 40.    Imaging: No results found.  Speciality Comments: No specialty comments available.    Procedures:  No procedures performed Allergies: Pregabalin; Augmentin [amoxicillin-pot clavulanate]; Cymbalta [duloxetine hcl]; Sulfa antibiotics; and Omeprazole   Assessment / Plan:     Visit Diagnoses: Fibromyalgia : She continues to have some generalized pain and discomfort and positive tender points. The pain is manageable. She states Flexeril does help the muscle spasms but causes hangover effect the next day. I've discontinued her Flexeril and given her prescription for tizanidine 4 mg by mouth daily at bedtime. Side effects were reviewed.  Lower back pain.: She's been having some discomfort over the right piriformis area and right SI joint area. I offered physical therapy which she declined. Some exercise were demonstrated and discussed in the office today.  Primary insomnia: She has medications for chronic insomnia  Other fatigue: Fatigue is  related to insomnia  Primary osteoarthritis of both hands: She does have osteoarthritic changes but not much discomfort in her hands.  Greater trochanteric bursitis of both hips: ITB and exercise were discussed.  Primary osteoarthritis of both knees: Mild discomfort  Chondromalacia of patella, unspecified laterality  Primary osteoarthritis of both feet she's been using proper fitting shoes  History of IBS  Vitamin D deficiency    Orders: No orders of the defined types were placed in this encounter.  Meds ordered this encounter  Medications  . tiZANidine (ZANAFLEX) 4 MG tablet    Sig: Take 1 tablet (4 mg total) by mouth at bedtime. 1 tablet by mouth at bed time prn    Dispense:  30 tablet    Refill:  0    Face-to-face time spent with patient was 30 minutes. 50% of time was spent in counseling and coordination of care.  Follow-Up Instructions: Return in about 6 months (around 07/23/2017) for FMS, OA.   Bo Merino, MD  Note - This record has been created using Editor, commissioning.  Chart creation errors have been sought, but may not always  have been located. Such creation errors do not reflect on  the standard of medical care.

## 2017-01-16 DIAGNOSIS — I712 Thoracic aortic aneurysm, without rupture: Secondary | ICD-10-CM | POA: Diagnosis not present

## 2017-01-20 ENCOUNTER — Encounter: Payer: Self-pay | Admitting: Rheumatology

## 2017-01-20 ENCOUNTER — Ambulatory Visit (INDEPENDENT_AMBULATORY_CARE_PROVIDER_SITE_OTHER): Payer: BLUE CROSS/BLUE SHIELD | Admitting: Rheumatology

## 2017-01-20 VITALS — BP 104/64 | HR 78 | Resp 16 | Ht 72.0 in | Wt 161.0 lb

## 2017-01-20 DIAGNOSIS — M19071 Primary osteoarthritis, right ankle and foot: Secondary | ICD-10-CM | POA: Diagnosis not present

## 2017-01-20 DIAGNOSIS — M7062 Trochanteric bursitis, left hip: Secondary | ICD-10-CM | POA: Diagnosis not present

## 2017-01-20 DIAGNOSIS — M2240 Chondromalacia patellae, unspecified knee: Secondary | ICD-10-CM

## 2017-01-20 DIAGNOSIS — M19041 Primary osteoarthritis, right hand: Secondary | ICD-10-CM

## 2017-01-20 DIAGNOSIS — R5383 Other fatigue: Secondary | ICD-10-CM | POA: Diagnosis not present

## 2017-01-20 DIAGNOSIS — F5101 Primary insomnia: Secondary | ICD-10-CM | POA: Diagnosis not present

## 2017-01-20 DIAGNOSIS — M7061 Trochanteric bursitis, right hip: Secondary | ICD-10-CM | POA: Diagnosis not present

## 2017-01-20 DIAGNOSIS — E559 Vitamin D deficiency, unspecified: Secondary | ICD-10-CM

## 2017-01-20 DIAGNOSIS — M19042 Primary osteoarthritis, left hand: Secondary | ICD-10-CM | POA: Diagnosis not present

## 2017-01-20 DIAGNOSIS — M797 Fibromyalgia: Secondary | ICD-10-CM

## 2017-01-20 DIAGNOSIS — M47816 Spondylosis without myelopathy or radiculopathy, lumbar region: Secondary | ICD-10-CM | POA: Diagnosis not present

## 2017-01-20 DIAGNOSIS — Z8719 Personal history of other diseases of the digestive system: Secondary | ICD-10-CM

## 2017-01-20 DIAGNOSIS — M19072 Primary osteoarthritis, left ankle and foot: Secondary | ICD-10-CM

## 2017-01-20 DIAGNOSIS — M170 Bilateral primary osteoarthritis of knee: Secondary | ICD-10-CM

## 2017-01-20 DIAGNOSIS — M545 Low back pain: Secondary | ICD-10-CM | POA: Diagnosis not present

## 2017-01-20 MED ORDER — TIZANIDINE HCL 4 MG PO TABS: 4.0000 mg | ORAL_TABLET | Freq: Every day | ORAL | 0 refills | Status: DC

## 2017-01-20 NOTE — Patient Instructions (Signed)

## 2017-01-30 DIAGNOSIS — M545 Low back pain: Secondary | ICD-10-CM | POA: Diagnosis not present

## 2017-01-30 DIAGNOSIS — M47816 Spondylosis without myelopathy or radiculopathy, lumbar region: Secondary | ICD-10-CM | POA: Diagnosis not present

## 2017-01-31 DIAGNOSIS — J029 Acute pharyngitis, unspecified: Secondary | ICD-10-CM | POA: Diagnosis not present

## 2017-01-31 DIAGNOSIS — R69 Illness, unspecified: Secondary | ICD-10-CM | POA: Diagnosis not present

## 2017-02-03 DIAGNOSIS — J988 Other specified respiratory disorders: Secondary | ICD-10-CM | POA: Diagnosis not present

## 2017-02-03 DIAGNOSIS — Z8709 Personal history of other diseases of the respiratory system: Secondary | ICD-10-CM | POA: Diagnosis not present

## 2017-02-03 DIAGNOSIS — J111 Influenza due to unidentified influenza virus with other respiratory manifestations: Secondary | ICD-10-CM | POA: Diagnosis not present

## 2017-02-03 DIAGNOSIS — Z902 Acquired absence of lung [part of]: Secondary | ICD-10-CM | POA: Diagnosis not present

## 2017-02-04 ENCOUNTER — Telehealth: Payer: Self-pay | Admitting: Rheumatology

## 2017-02-04 NOTE — Telephone Encounter (Signed)
Patient called stating that the sleep medication tizanidine Dr. Corliss Skainseveshwar prescribed for her is only working until about 4am, she is wanting to know if she can take a little more or if Dr. Corliss Skainseveshwar will up her dose.  CB#[phone].  Thank you.

## 2017-02-05 NOTE — Telephone Encounter (Signed)
No can not increase the dose. She can add Melatonin to it.

## 2017-02-05 NOTE — Telephone Encounter (Signed)
Patient states she is having trouble sleeping. Patient states she she is taking Tizanidine and but she is waking up at approximately 4 am everyday. Patient would like to know if we increasing her dose would help. Please advise

## 2017-02-06 DIAGNOSIS — J329 Chronic sinusitis, unspecified: Secondary | ICD-10-CM | POA: Diagnosis not present

## 2017-02-06 NOTE — Telephone Encounter (Signed)
Patient advised she can add Melatonin to the Tizanidine. Patient verbalized understanding and states she will try that.

## 2017-02-17 DIAGNOSIS — M545 Low back pain: Secondary | ICD-10-CM | POA: Diagnosis not present

## 2017-02-17 DIAGNOSIS — M47816 Spondylosis without myelopathy or radiculopathy, lumbar region: Secondary | ICD-10-CM | POA: Diagnosis not present

## 2017-02-18 DIAGNOSIS — I712 Thoracic aortic aneurysm, without rupture: Secondary | ICD-10-CM | POA: Diagnosis not present

## 2017-02-18 DIAGNOSIS — I359 Nonrheumatic aortic valve disorder, unspecified: Secondary | ICD-10-CM | POA: Diagnosis not present

## 2017-02-21 ENCOUNTER — Other Ambulatory Visit: Payer: Self-pay | Admitting: Rheumatology

## 2017-02-23 NOTE — Telephone Encounter (Signed)
Last Visit: 01/20/17 Next Visit: 07/23/17  Okay to refill tizanidine?

## 2017-02-23 NOTE — Telephone Encounter (Signed)
ok 

## 2017-03-12 DIAGNOSIS — F331 Major depressive disorder, recurrent, moderate: Secondary | ICD-10-CM | POA: Diagnosis not present

## 2017-03-17 DIAGNOSIS — F331 Major depressive disorder, recurrent, moderate: Secondary | ICD-10-CM | POA: Diagnosis not present

## 2017-03-23 ENCOUNTER — Other Ambulatory Visit: Payer: Self-pay | Admitting: Rheumatology

## 2017-03-23 NOTE — Telephone Encounter (Signed)
Last Visit: 01/20/17 Next Visit: 07/23/17  Okay to refill per Dr. Deveshwar 

## 2017-03-24 DIAGNOSIS — F321 Major depressive disorder, single episode, moderate: Secondary | ICD-10-CM | POA: Diagnosis not present

## 2017-03-25 DIAGNOSIS — F331 Major depressive disorder, recurrent, moderate: Secondary | ICD-10-CM | POA: Diagnosis not present

## 2017-03-27 DIAGNOSIS — M545 Low back pain: Secondary | ICD-10-CM | POA: Diagnosis not present

## 2017-03-27 DIAGNOSIS — M47816 Spondylosis without myelopathy or radiculopathy, lumbar region: Secondary | ICD-10-CM | POA: Diagnosis not present

## 2017-03-31 DIAGNOSIS — F331 Major depressive disorder, recurrent, moderate: Secondary | ICD-10-CM | POA: Diagnosis not present

## 2017-04-14 DIAGNOSIS — F331 Major depressive disorder, recurrent, moderate: Secondary | ICD-10-CM | POA: Diagnosis not present

## 2017-04-16 DIAGNOSIS — F331 Major depressive disorder, recurrent, moderate: Secondary | ICD-10-CM | POA: Diagnosis not present

## 2017-04-20 ENCOUNTER — Other Ambulatory Visit: Payer: Self-pay | Admitting: Rheumatology

## 2017-04-20 NOTE — Telephone Encounter (Signed)
Last Visit: 01/20/17 Next Visit: 07/23/17  Okay to refill per Dr. Corliss Skainseveshwar

## 2017-04-21 DIAGNOSIS — F321 Major depressive disorder, single episode, moderate: Secondary | ICD-10-CM | POA: Diagnosis not present

## 2017-04-22 ENCOUNTER — Telehealth: Payer: Self-pay

## 2017-04-22 ENCOUNTER — Other Ambulatory Visit: Payer: Self-pay | Admitting: Radiology

## 2017-04-22 MED ORDER — DICLOFENAC SODIUM 1 % TD GEL: TRANSDERMAL | 3 refills | Status: DC

## 2017-04-22 NOTE — Telephone Encounter (Signed)
A prior authorization for Generic Voltaren gel has been submitted to patient's insurance via cover my meds. Will update once we have a response.   Joeph Szatkowski, [place], CPhT 9:22 AM

## 2017-04-22 NOTE — Telephone Encounter (Signed)
Refill request received via fax for Voltaren gel  Ok to refill per Dr Deveshwar  

## 2017-04-22 NOTE — Telephone Encounter (Signed)
Received a confirmation from Cover My Meds regarding a prior authorization approval for Diclofenac gel from 04/22/2017 to 09/07/2038.   Reference number:none Phone number:none  Called patient to update her. Left message for patient to cal back.  Damain Broadus, [place], CPhT 9:34 AM

## 2017-05-05 DIAGNOSIS — F331 Major depressive disorder, recurrent, moderate: Secondary | ICD-10-CM | POA: Diagnosis not present

## 2017-05-12 DIAGNOSIS — M545 Low back pain: Secondary | ICD-10-CM | POA: Diagnosis not present

## 2017-05-12 DIAGNOSIS — M47816 Spondylosis without myelopathy or radiculopathy, lumbar region: Secondary | ICD-10-CM | POA: Diagnosis not present

## 2017-05-18 DIAGNOSIS — F331 Major depressive disorder, recurrent, moderate: Secondary | ICD-10-CM | POA: Diagnosis not present

## 2017-06-01 ENCOUNTER — Other Ambulatory Visit: Payer: Self-pay | Admitting: Rheumatology

## 2017-06-01 NOTE — Telephone Encounter (Signed)
Last Visit: 01/20/17 Next Visit: 07/23/17  Okay to refill per Dr. Deveshwar 

## 2017-06-08 DIAGNOSIS — F321 Major depressive disorder, single episode, moderate: Secondary | ICD-10-CM | POA: Diagnosis not present

## 2017-06-09 DIAGNOSIS — F331 Major depressive disorder, recurrent, moderate: Secondary | ICD-10-CM | POA: Diagnosis not present

## 2017-06-12 DIAGNOSIS — F331 Major depressive disorder, recurrent, moderate: Secondary | ICD-10-CM | POA: Diagnosis not present

## 2017-06-24 DIAGNOSIS — F331 Major depressive disorder, recurrent, moderate: Secondary | ICD-10-CM | POA: Diagnosis not present

## 2017-07-05 ENCOUNTER — Other Ambulatory Visit: Payer: Self-pay | Admitting: Rheumatology

## 2017-07-06 NOTE — Telephone Encounter (Signed)
Last Visit: 01/20/17 Next Visit: 07/23/17  Okay to refill per Dr. Deveshwar 

## 2017-07-07 DIAGNOSIS — F331 Major depressive disorder, recurrent, moderate: Secondary | ICD-10-CM | POA: Diagnosis not present

## 2017-07-09 DIAGNOSIS — F331 Major depressive disorder, recurrent, moderate: Secondary | ICD-10-CM | POA: Diagnosis not present

## 2017-07-14 NOTE — Progress Notes (Signed)
Office Visit Note  Patient: Joann Smith             Date of Birth: [birth year]           MRN: [id number]             PCP: Daisy Florooss, Charles Alan, MD Referring: Daisy Florooss, Charles Alan, MD Visit Date: 07/23/2017 Occupation: @GUAROCC @    Subjective:  Medication Management   History of Present Illness: Joann Smith is a 56 y.o. female with history of fibromyalgia syndrome and osteoarthritis. She's been having a flare of fibromyalgia with generalized pain and discomfort currently. She states since the last visit she has a started an antidepressant which is helped her mood. The she's complaining of bilateral trapezius pain, lower back pain and also generalized pain.  Activities of Daily Living:  Patient reports morning stiffness for 30  minutes.   Patient Denies nocturnal pain.  Difficulty dressing/grooming: Denies Difficulty climbing stairs: Reports Difficulty getting out of chair: Reports Difficulty using hands for taps, buttons, cutlery, and/or writing: Denies   Review of Systems  Constitutional: Positive for fatigue. Negative for night sweats, weight gain, weight loss and weakness.  HENT: Negative for mouth sores, trouble swallowing, trouble swallowing, mouth dryness and nose dryness.   Eyes: Positive for dryness. Negative for pain, redness and visual disturbance.  Respiratory: Negative for cough, shortness of breath and difficulty breathing.   Cardiovascular: Negative for chest pain, palpitations, hypertension, irregular heartbeat and swelling in legs/feet.  Gastrointestinal: Negative for blood in stool, constipation and diarrhea.  Endocrine: Negative for increased urination.  Genitourinary: Negative for vaginal dryness.  Musculoskeletal: Positive for arthralgias, joint pain, myalgias, morning stiffness and myalgias. Negative for joint swelling, muscle weakness and muscle tenderness.  Skin: Negative for color change, rash, hair loss, skin tightness, ulcers and sensitivity to sunlight.    Allergic/Immunologic: Negative for susceptible to infections.  Neurological: Negative for dizziness, memory loss and night sweats.  Hematological: Negative for swollen glands.  Psychiatric/Behavioral: Positive for depressed mood and sleep disturbance. The patient is not nervous/anxious.     PMFS History:  Patient Active Problem List   Diagnosis Date Noted  . Fibromyalgia 07/09/2016  . Fatigue 07/09/2016  . Insomnia 07/09/2016  . Osteoarthritis of both hands 07/09/2016  . Osteoarthritis of both feet 07/09/2016  . Osteoarthritis of both knees 07/09/2016  . Chondromalacia, patella 07/09/2016  . Vitamin D deficiency 07/09/2016  . Pulmonary mass 07/09/2016  . IBS (irritable bowel syndrome) 07/09/2016  . IgA deficiency (HCC) 07/09/2016  . Liver nodule 07/09/2016  . Pedal edema 07/09/2016  . Greater trochanteric bursitis of both hips 07/09/2016    Past Medical History:  Diagnosis Date  . Chondromalacia, patella 07/09/2016   Moderate  . Enlarged aorta (HCC)   . Fatigue 07/09/2016  . Fibromyalgia 07/09/2016  . IBS (irritable bowel syndrome) 07/09/2016  . IgA deficiency (HCC) 07/09/2016  . Insomnia 07/09/2016  . Liver nodule 07/09/2016  . Osteoarthritis of both feet 07/09/2016  . Osteoarthritis of both hands 07/09/2016   Mild  . Osteoarthritis of both knees 07/09/2016   Moderate  . Osteoporosis   . Pedal edema 07/09/2016  . Pulmonary mass 07/09/2016   F/u with pulmonologist yearly   . Vitamin D deficiency 07/09/2016    Family History  Problem Relation Age of Onset  . Dementia Mother   . Cancer Father    Past Surgical History:  Procedure Laterality Date  . ABDOMINAL HYSTERECTOMY    . AUGMENTATION MAMMAPLASTY Bilateral   . LUNG REMOVAL,  PARTIAL Right 02/2016  . SHOULDER ARTHROSCOPY Left 06/2016   Social History   Social History Narrative  . Not on file     Objective: Vital Signs: BP (!) 97/58 (BP Location: Left Arm, Patient Position: Sitting, Cuff Size: Large)   Pulse 74    Resp 14   Ht 6' (1.829 m)   Wt 169 lb (76.7 kg)   LMP 07/10/2010   BMI 22.92 kg/m    Physical Exam  Constitutional: She is oriented to person, place, and time. She appears well-developed and well-nourished.  HENT:  Head: Normocephalic and atraumatic.  Eyes: Conjunctivae and EOM are normal.  Neck: Normal range of motion.  Cardiovascular: Normal rate, regular rhythm, normal heart sounds and intact distal pulses.  Pulmonary/Chest: Effort normal and breath sounds normal.  Abdominal: Soft. Bowel sounds are normal.  Lymphadenopathy:    She has no cervical adenopathy.  Neurological: She is alert and oriented to person, place, and time.  Skin: Skin is warm and dry. Capillary refill takes less than 2 seconds.  Psychiatric: She has a normal mood and affect. Her behavior is normal.  Nursing note and vitals reviewed.    Musculoskeletal Exam: C-spine and thoracic spine good range of motion. She has discomfort range of motion of her lumbar spine. Shoulder joints elbow joints wrist joint MCPs PIPs DIPs with good range of motion. She is thickening of PIP/DIP joints consistent with osteoarthritis. Hip joints, knee joints, ankle joints are good range of motion with no synovitis. Fibromyalgia tender points with 11 out of 18 positive. She had tenderness over bilateral trochanteric area she also had tenderness over bilateral trapezius area.  CDAI Exam: No CDAI exam completed.    Investigation: No additional findings.   Imaging: No results found.  Speciality Comments: No specialty comments available.    Procedures:  No procedures performed Allergies: Pregabalin; Augmentin [amoxicillin-pot clavulanate]; Cymbalta [duloxetine hcl]; Sulfa antibiotics; and Omeprazole   Assessment / Plan:     Visit Diagnoses: Fibromyalgia - she continues to have some generalized pain and discomfort. She's been taking Tizanidine at that time which is been helpful.she's been having some myalgias. Have discussed the  use of magnesium malate 250 mg by mouth daily at bedtime as tolerated. Side effects were discussed.  Other fatigue: Secondary to insomnia  Primary insomnia: Her insomnia is better with medications.  Primary osteoarthritis of both hands: She has moderate osteoarthritis in her hands.  Greater trochanteric bursitis of both hips; She continues to have some tenderness over trochanteric area  Primary osteoarthritis of both knees - chondromalacia patella, on Voltaren gel  Chondromalacia of patella, unspecified laterality  Primary osteoarthritis of both feet: Doing well  DDD (degenerative disc disease), lumbar: [facility] and had injections in the past. She continues to have some lower back pain.  History of vitamin D deficiency: She is on supplement.  History of IBS  Anxiety and depression : She was recently started on Lexapro.   Orders: No orders of the defined types were placed in this encounter.  No orders of the defined types were placed in this encounter.    Follow-Up Instructions: Return in about 6 months (around 01/20/2018) for Osteoarthritis.   Pollyann SavoyShaili Guillermina Shaft, MD  Note - This record has been created using Animal nutritionistDragon software.  Chart creation errors have been sought, but may not always  have been located. Such creation errors do not reflect on  the standard of medical care.

## 2017-07-23 ENCOUNTER — Encounter: Payer: Self-pay | Admitting: Rheumatology

## 2017-07-23 ENCOUNTER — Encounter (INDEPENDENT_AMBULATORY_CARE_PROVIDER_SITE_OTHER): Payer: Self-pay

## 2017-07-23 ENCOUNTER — Ambulatory Visit (INDEPENDENT_AMBULATORY_CARE_PROVIDER_SITE_OTHER): Payer: BLUE CROSS/BLUE SHIELD | Admitting: Rheumatology

## 2017-07-23 VITALS — BP 97/58 | HR 74 | Resp 14 | Ht 72.0 in | Wt 169.0 lb

## 2017-07-23 DIAGNOSIS — R5383 Other fatigue: Secondary | ICD-10-CM

## 2017-07-23 DIAGNOSIS — M19041 Primary osteoarthritis, right hand: Secondary | ICD-10-CM

## 2017-07-23 DIAGNOSIS — Z8719 Personal history of other diseases of the digestive system: Secondary | ICD-10-CM

## 2017-07-23 DIAGNOSIS — F329 Major depressive disorder, single episode, unspecified: Secondary | ICD-10-CM

## 2017-07-23 DIAGNOSIS — M545 Low back pain: Secondary | ICD-10-CM | POA: Diagnosis not present

## 2017-07-23 DIAGNOSIS — M19042 Primary osteoarthritis, left hand: Secondary | ICD-10-CM

## 2017-07-23 DIAGNOSIS — M461 Sacroiliitis, not elsewhere classified: Secondary | ICD-10-CM | POA: Diagnosis not present

## 2017-07-23 DIAGNOSIS — M7061 Trochanteric bursitis, right hip: Secondary | ICD-10-CM

## 2017-07-23 DIAGNOSIS — M5136 Other intervertebral disc degeneration, lumbar region: Secondary | ICD-10-CM | POA: Diagnosis not present

## 2017-07-23 DIAGNOSIS — M170 Bilateral primary osteoarthritis of knee: Secondary | ICD-10-CM | POA: Diagnosis not present

## 2017-07-23 DIAGNOSIS — Z8639 Personal history of other endocrine, nutritional and metabolic disease: Secondary | ICD-10-CM | POA: Diagnosis not present

## 2017-07-23 DIAGNOSIS — M19071 Primary osteoarthritis, right ankle and foot: Secondary | ICD-10-CM | POA: Diagnosis not present

## 2017-07-23 DIAGNOSIS — M7062 Trochanteric bursitis, left hip: Secondary | ICD-10-CM

## 2017-07-23 DIAGNOSIS — F419 Anxiety disorder, unspecified: Secondary | ICD-10-CM

## 2017-07-23 DIAGNOSIS — M797 Fibromyalgia: Secondary | ICD-10-CM | POA: Diagnosis not present

## 2017-07-23 DIAGNOSIS — M2240 Chondromalacia patellae, unspecified knee: Secondary | ICD-10-CM | POA: Diagnosis not present

## 2017-07-23 DIAGNOSIS — F5101 Primary insomnia: Secondary | ICD-10-CM | POA: Diagnosis not present

## 2017-07-23 DIAGNOSIS — M47816 Spondylosis without myelopathy or radiculopathy, lumbar region: Secondary | ICD-10-CM | POA: Diagnosis not present

## 2017-07-23 DIAGNOSIS — F32A Depression, unspecified: Secondary | ICD-10-CM

## 2017-07-23 DIAGNOSIS — M19072 Primary osteoarthritis, left ankle and foot: Secondary | ICD-10-CM

## 2017-07-23 NOTE — Patient Instructions (Signed)
Magnesium malate 250 mg by mouth at bed time as tolerated.

## 2017-07-24 DIAGNOSIS — F331 Major depressive disorder, recurrent, moderate: Secondary | ICD-10-CM | POA: Diagnosis not present

## 2017-08-03 DIAGNOSIS — Z1272 Encounter for screening for malignant neoplasm of vagina: Secondary | ICD-10-CM | POA: Diagnosis not present

## 2017-08-03 DIAGNOSIS — Z6822 Body mass index (BMI) 22.0-22.9, adult: Secondary | ICD-10-CM | POA: Diagnosis not present

## 2017-08-03 DIAGNOSIS — Z01419 Encounter for gynecological examination (general) (routine) without abnormal findings: Secondary | ICD-10-CM | POA: Diagnosis not present

## 2017-08-04 DIAGNOSIS — F321 Major depressive disorder, single episode, moderate: Secondary | ICD-10-CM | POA: Diagnosis not present

## 2017-08-07 DIAGNOSIS — Z1231 Encounter for screening mammogram for malignant neoplasm of breast: Secondary | ICD-10-CM | POA: Diagnosis not present

## 2017-08-07 DIAGNOSIS — Z6822 Body mass index (BMI) 22.0-22.9, adult: Secondary | ICD-10-CM | POA: Diagnosis not present

## 2017-08-10 DIAGNOSIS — J479 Bronchiectasis, uncomplicated: Secondary | ICD-10-CM | POA: Diagnosis not present

## 2017-08-13 DIAGNOSIS — F331 Major depressive disorder, recurrent, moderate: Secondary | ICD-10-CM | POA: Diagnosis not present

## 2017-09-14 DIAGNOSIS — F331 Major depressive disorder, recurrent, moderate: Secondary | ICD-10-CM | POA: Diagnosis not present

## 2017-09-29 DIAGNOSIS — F321 Major depressive disorder, single episode, moderate: Secondary | ICD-10-CM | POA: Diagnosis not present

## 2017-10-06 DIAGNOSIS — F331 Major depressive disorder, recurrent, moderate: Secondary | ICD-10-CM | POA: Diagnosis not present

## 2017-10-24 DIAGNOSIS — N390 Urinary tract infection, site not specified: Secondary | ICD-10-CM | POA: Diagnosis not present

## 2017-10-24 DIAGNOSIS — R319 Hematuria, unspecified: Secondary | ICD-10-CM | POA: Diagnosis not present

## 2017-10-24 DIAGNOSIS — R300 Dysuria: Secondary | ICD-10-CM | POA: Diagnosis not present

## 2017-10-27 DIAGNOSIS — F331 Major depressive disorder, recurrent, moderate: Secondary | ICD-10-CM | POA: Diagnosis not present

## 2017-11-06 DIAGNOSIS — R635 Abnormal weight gain: Secondary | ICD-10-CM | POA: Diagnosis not present

## 2017-11-06 DIAGNOSIS — N390 Urinary tract infection, site not specified: Secondary | ICD-10-CM | POA: Diagnosis not present

## 2017-11-12 DIAGNOSIS — F331 Major depressive disorder, recurrent, moderate: Secondary | ICD-10-CM | POA: Diagnosis not present

## 2017-11-30 DIAGNOSIS — F321 Major depressive disorder, single episode, moderate: Secondary | ICD-10-CM | POA: Diagnosis not present

## 2017-12-01 DIAGNOSIS — F331 Major depressive disorder, recurrent, moderate: Secondary | ICD-10-CM | POA: Diagnosis not present

## 2017-12-22 DIAGNOSIS — Z0000 Encounter for general adult medical examination without abnormal findings: Secondary | ICD-10-CM | POA: Diagnosis not present

## 2017-12-22 DIAGNOSIS — Z13220 Encounter for screening for lipoid disorders: Secondary | ICD-10-CM | POA: Diagnosis not present

## 2017-12-23 DIAGNOSIS — F331 Major depressive disorder, recurrent, moderate: Secondary | ICD-10-CM | POA: Diagnosis not present

## 2017-12-28 DIAGNOSIS — F331 Major depressive disorder, recurrent, moderate: Secondary | ICD-10-CM | POA: Diagnosis not present

## 2018-01-01 DIAGNOSIS — F331 Major depressive disorder, recurrent, moderate: Secondary | ICD-10-CM | POA: Diagnosis not present

## 2018-01-02 DIAGNOSIS — F331 Major depressive disorder, recurrent, moderate: Secondary | ICD-10-CM | POA: Diagnosis not present

## 2018-01-04 DIAGNOSIS — F331 Major depressive disorder, recurrent, moderate: Secondary | ICD-10-CM | POA: Diagnosis not present

## 2018-01-07 NOTE — Progress Notes (Signed)
Office Visit Note  Patient: Joann Smith             Date of Birth: [birth year]           MRN: [id number]             PCP: Daisy Floro, MD Referring: Daisy Floro, MD Visit Date: 01/21/2018 Occupation: @    Subjective:  Generalized muscle aches and tenderness   History of Present Illness: Joann Smith is a 57 y.o. female with history of fibromyalgia, osteoarthritis, and DDD.  Patient states that overall she has been doing well since her last visit.  She continues to have some muscle aches and muscle tenderness due to fibromyalgia.  She states that she has been exercising a regular basis by doing Zumba and yoga.  She states that she is also been try to lose weight and changing her diet habits.  She reports that she has been sleeping better but continues to have fatigue.  She states that she is also starting to have more pain in her hands but denies any joint swelling.  She will occasionally have discomfort in her knees and has to limit some of her activities.  She continues to have plantar fasciitis bilaterally.  She uses shoe inserts as well as stretching and massage which gave her some relief.  She states she continues to have discomfort in her lower back but would not like to have another injection due to the loss will only providing temporary relief.    Activities of Daily Living:  Patient reports morning stiffness for 45-60 minutes.   Patient Reports nocturnal pain.  Difficulty dressing/grooming: Denies Difficulty climbing stairs: Reports Difficulty getting out of chair: Denies Difficulty using hands for taps, buttons, cutlery, and/or writing: Denies   Review of Systems  Constitutional: Positive for fatigue.  HENT: Negative for mouth sores, mouth dryness and nose dryness.   Eyes: Negative for pain, visual disturbance and dryness.  Respiratory: Negative for cough, hemoptysis, shortness of breath and difficulty breathing.   Cardiovascular: Negative for chest  pain, palpitations, hypertension and swelling in legs/feet.  Gastrointestinal: Negative for blood in stool, constipation and diarrhea.  Endocrine: Positive for increased urination (Being treated for UTI ).  Genitourinary: Negative for painful urination.  Musculoskeletal: Positive for arthralgias, joint pain, myalgias, morning stiffness, muscle tenderness and myalgias. Negative for joint swelling and muscle weakness.  Skin: Negative for color change, pallor, rash, hair loss, nodules/bumps, skin tightness, ulcers and sensitivity to sunlight.  Allergic/Immunologic: Negative for susceptible to infections.  Neurological: Negative for dizziness, numbness, headaches and weakness.  Hematological: Negative for swollen glands.  Psychiatric/Behavioral: Positive for depressed mood and sleep disturbance. The patient is not nervous/anxious.     PMFS History:  Patient Active Problem List   Diagnosis Date Noted  . Fibromyalgia 07/09/2016  . Fatigue 07/09/2016  . Insomnia 07/09/2016  . Osteoarthritis of both hands 07/09/2016  . Osteoarthritis of both feet 07/09/2016  . Osteoarthritis of both knees 07/09/2016  . Chondromalacia, patella 07/09/2016  . Vitamin D deficiency 07/09/2016  . Pulmonary mass 07/09/2016  . IBS (irritable bowel syndrome) 07/09/2016  . IgA deficiency (HCC) 07/09/2016  . Liver nodule 07/09/2016  . Pedal edema 07/09/2016  . Greater trochanteric bursitis of both hips 07/09/2016    Past Medical History:  Diagnosis Date  . Chondromalacia, patella 07/09/2016   Moderate  . Enlarged aorta (HCC)   . Fatigue 07/09/2016  . Fibromyalgia 07/09/2016  . IBS (irritable bowel syndrome) 07/09/2016  . IgA  deficiency (HCC) 07/09/2016  . Insomnia 07/09/2016  . Liver nodule 07/09/2016  . Osteoarthritis of both feet 07/09/2016  . Osteoarthritis of both hands 07/09/2016   Mild  . Osteoarthritis of both knees 07/09/2016   Moderate  . Osteoporosis   . Pedal edema 07/09/2016  . Pulmonary mass 07/09/2016     F/u with pulmonologist yearly   . Vitamin D deficiency 07/09/2016    Family History  Problem Relation Age of Onset  . Dementia Mother   . Osteoarthritis Mother   . Cancer Father   . Arthritis Sister   . Thyroid disease Sister   . Arthritis Sister   . Depression Son    Past Surgical History:  Procedure Laterality Date  . ABDOMINAL HYSTERECTOMY    . AUGMENTATION MAMMAPLASTY Bilateral   . LUNG REMOVAL, PARTIAL Right 02/2016  . SHOULDER ARTHROSCOPY Left 06/2016   Social History   Social History Narrative  . Not on file     Objective: Vital Signs: BP 107/70 (BP Location: Left Arm, Patient Position: Sitting, Cuff Size: Normal)   Pulse 67   Resp 15   Ht 6' (1.829 m)   Wt 166 lb (75.3 kg)   LMP 07/10/2010   BMI 22.51 kg/m    Physical Exam  Constitutional: She is oriented to person, place, and time. She appears well-developed and well-nourished.  HENT:  Head: Normocephalic and atraumatic.  Eyes: Conjunctivae and EOM are normal.  Neck: Normal range of motion.  Cardiovascular: Normal rate, regular rhythm, normal heart sounds and intact distal pulses.  Pulmonary/Chest: Effort normal and breath sounds normal.  Abdominal: Soft. Bowel sounds are normal.  Lymphadenopathy:    She has no cervical adenopathy.  Neurological: She is alert and oriented to person, place, and time.  Skin: Skin is warm and dry. Capillary refill takes less than 2 seconds.  Psychiatric: She has a normal mood and affect. Her behavior is normal.  Nursing note and vitals reviewed.    Musculoskeletal Exam: C-spine, thoracic spine, lumbar spine good ROM.  No midline spinal tenderness.  No SI joint tenderness.  Shoulder joints, elbow joints, wrist joints, MCPs, PIPs, and DIPs good ROM with no synovitis.  PIP and DIP synovial thickening consistent with osteoarthritis.  Hip joints, knee joints, ankle joints, MTPs, PIPs, and DIPs good ROM with no synovitis.  No warmth or effusion of knee joints.  CDAI Exam: No  CDAI exam completed.    Investigation: No additional findings.   Imaging: No results found.  Speciality Comments: No specialty comments available.    Procedures:  No procedures performed Allergies: Pregabalin; Augmentin [amoxicillin-pot clavulanate]; Cymbalta [duloxetine hcl]; Sulfa antibiotics; and Omeprazole   Assessment / Plan:     Visit Diagnoses: Fibromyalgia: She continues to have generalized muscle aches and muscle tenderness due to fibromyalgia.  Her insomnia has improved but she continues to have fatigue.  She has been exercising on a regular basis by going to Zumba and yoga.  She feels that her increased stress is what is causing her fibromyalgia flares.  She states that she is currently trying to lose weight has been changing her diet habits.  She was encouraged to continue to exercise on a regular basis.  We also discussed trying meditation and other stress relieving activities.  Primary insomnia: Improved.  Other fatigue: Chronic.  She was encouraged to continue to stay active and exercise on a regular basis.  Primary osteoarthritis of both hands: PIP and DIP synovial thickening consistent with osteoarthritis of bilateral hands.  She is been having increased pain and stiffness in her bilateral hands.  She has no joint swelling.  She has no synovitis on exam.  She was given a handout of hand exercises that she can perform at home.  Joint protection muscle strengthening were discussed.  She can also use Voltaren gel on her hands.  She does not need a refill of Voltaren gel at this time.  Primary osteoarthritis of both knees: No warmth or effusion.  She has good range of motion bilateral knees.  She will occasionally have discomfort in her knees enough to limit her activities.  She continues to go to [place] and yoga on a regular basis.  Primary osteoarthritis of both feet: She has PIP and DIP synovial thickening consistent with osteoarthritis of bilateral feet.  She continues to  have plantar fasciitis and bilateral feet.  We suggested that she try going to shoe market and get fitted for orthotics.  She can also continue to stretch and massage her plantar pressure.  Chondromalacia of patella, unspecified laterality  Greater trochanteric bursitis of both hips: She has tenderness of bilateral trochanteric bursa.  She performs stretching exercises on a regular basis.  She can try going to yoga more frequently.  DDD (degenerative disc disease), lumbar - She's been followed at [facility] office and had injections in the past.  She is not going to have another injection in the future due to would only provide temporary relief and being painful procedure.  Other medical conditions are listed as follows:  History of anxiety  History of depression  History of vitamin D deficiency  History of IBS    Orders: No orders of the defined types were placed in this encounter.  No orders of the defined types were placed in this encounter.    Follow-Up Instructions: Return in about 6 months (around 07/24/2018) for Fibromyalgia, Osteoarthritis, DDD.   Gearldine Bienenstock, PA-C   I examined and evaluated the patient with Joann Ales PA.  Patient had no synovitis on examination.  She still have significant arthritis in her hands and feet.  She continues to have discomfort in the lower back due to underlying disc disease.  The plan of care was discussed as noted above.  Joann Savoy, MD  Note - This record has been created using Animal nutritionist.  Chart creation errors have been sought, but may not always  have been located. Such creation errors do not reflect on  the standard of medical care.

## 2018-01-12 DIAGNOSIS — F331 Major depressive disorder, recurrent, moderate: Secondary | ICD-10-CM | POA: Diagnosis not present

## 2018-01-18 DIAGNOSIS — N390 Urinary tract infection, site not specified: Secondary | ICD-10-CM | POA: Diagnosis not present

## 2018-01-19 DIAGNOSIS — I719 Aortic aneurysm of unspecified site, without rupture: Secondary | ICD-10-CM | POA: Insufficient documentation

## 2018-01-19 DIAGNOSIS — I712 Thoracic aortic aneurysm, without rupture: Secondary | ICD-10-CM | POA: Diagnosis not present

## 2018-01-19 DIAGNOSIS — I7121 Aneurysm of the ascending aorta, without rupture: Secondary | ICD-10-CM | POA: Insufficient documentation

## 2018-01-20 DIAGNOSIS — F331 Major depressive disorder, recurrent, moderate: Secondary | ICD-10-CM | POA: Diagnosis not present

## 2018-01-21 ENCOUNTER — Ambulatory Visit: Payer: BLUE CROSS/BLUE SHIELD | Admitting: Rheumatology

## 2018-01-21 ENCOUNTER — Encounter: Payer: Self-pay | Admitting: Rheumatology

## 2018-01-21 VITALS — BP 107/70 | HR 67 | Resp 15 | Ht 72.0 in | Wt 166.0 lb

## 2018-01-21 DIAGNOSIS — M2240 Chondromalacia patellae, unspecified knee: Secondary | ICD-10-CM

## 2018-01-21 DIAGNOSIS — Z8719 Personal history of other diseases of the digestive system: Secondary | ICD-10-CM

## 2018-01-21 DIAGNOSIS — M797 Fibromyalgia: Secondary | ICD-10-CM | POA: Diagnosis not present

## 2018-01-21 DIAGNOSIS — Z8659 Personal history of other mental and behavioral disorders: Secondary | ICD-10-CM

## 2018-01-21 DIAGNOSIS — F5101 Primary insomnia: Secondary | ICD-10-CM

## 2018-01-21 DIAGNOSIS — M7061 Trochanteric bursitis, right hip: Secondary | ICD-10-CM

## 2018-01-21 DIAGNOSIS — R5383 Other fatigue: Secondary | ICD-10-CM | POA: Diagnosis not present

## 2018-01-21 DIAGNOSIS — M19042 Primary osteoarthritis, left hand: Secondary | ICD-10-CM

## 2018-01-21 DIAGNOSIS — M170 Bilateral primary osteoarthritis of knee: Secondary | ICD-10-CM | POA: Diagnosis not present

## 2018-01-21 DIAGNOSIS — Z8639 Personal history of other endocrine, nutritional and metabolic disease: Secondary | ICD-10-CM | POA: Diagnosis not present

## 2018-01-21 DIAGNOSIS — M19071 Primary osteoarthritis, right ankle and foot: Secondary | ICD-10-CM

## 2018-01-21 DIAGNOSIS — M7062 Trochanteric bursitis, left hip: Secondary | ICD-10-CM

## 2018-01-21 DIAGNOSIS — M5136 Other intervertebral disc degeneration, lumbar region: Secondary | ICD-10-CM | POA: Diagnosis not present

## 2018-01-21 DIAGNOSIS — M19041 Primary osteoarthritis, right hand: Secondary | ICD-10-CM

## 2018-01-21 DIAGNOSIS — M19072 Primary osteoarthritis, left ankle and foot: Secondary | ICD-10-CM

## 2018-01-21 NOTE — Patient Instructions (Signed)

## 2018-01-26 DIAGNOSIS — F331 Major depressive disorder, recurrent, moderate: Secondary | ICD-10-CM | POA: Diagnosis not present

## 2018-01-27 DIAGNOSIS — F321 Major depressive disorder, single episode, moderate: Secondary | ICD-10-CM | POA: Diagnosis not present

## 2018-02-04 ENCOUNTER — Ambulatory Visit (INDEPENDENT_AMBULATORY_CARE_PROVIDER_SITE_OTHER): Payer: BLUE CROSS/BLUE SHIELD

## 2018-02-04 ENCOUNTER — Ambulatory Visit: Payer: BLUE CROSS/BLUE SHIELD | Admitting: Podiatry

## 2018-02-04 ENCOUNTER — Encounter: Payer: Self-pay | Admitting: Podiatry

## 2018-02-04 VITALS — BP 103/63 | HR 62 | Resp 16

## 2018-02-04 DIAGNOSIS — M722 Plantar fascial fibromatosis: Secondary | ICD-10-CM

## 2018-02-04 MED ORDER — TRIAMCINOLONE ACETONIDE 10 MG/ML IJ SUSP
10.0000 mg | Freq: Once | INTRAMUSCULAR | Status: AC
  Administered 2018-02-04: 10 mg

## 2018-02-04 MED ORDER — DICLOFENAC SODIUM 75 MG PO TBEC: 75.0000 mg | DELAYED_RELEASE_TABLET | Freq: Two times a day (BID) | ORAL | 2 refills | Status: DC

## 2018-02-04 NOTE — Progress Notes (Signed)
   Subjective:    Patient ID: Joann Smith, female    DOB: 15-Jun-1961, 57 y.o.   MRN: [id number]  HPI    Review of Systems  All other systems reviewed and are negative.      Objective:   Physical Exam        Assessment & Plan:

## 2018-02-04 NOTE — Patient Instructions (Signed)

## 2018-02-04 NOTE — Progress Notes (Signed)
Subjective:   Patient ID: Joann Smith, female   DOB: 57 y.o.   MRN: [id number]   HPI Patient presents stating she is had a 2-year history of pain in her heels and they have been getting gradually worse.  States the right is worse than the left in the last 6 months have been increasingly hard for her to walk.  Patient states that she currently does not smoke and likes to be active   Review of Systems  All other systems reviewed and are negative.       Objective:  Physical Exam  Constitutional: She appears well-developed and well-nourished.  Cardiovascular: Intact distal pulses.  Pulmonary/Chest: Effort normal.  Musculoskeletal: Normal range of motion.  Neurological: She is alert.  Skin: Skin is warm.  Nursing note and vitals reviewed.   Neurovascular status intact muscle strength is adequate range of motion within normal limits with patient found to have exquisite discomfort plantar aspect heel region right over left with inflammation fluid at the insertion of the fascia into the calcaneus.  Patient has a moderate cavus foot structure and has tried padding therapy over-the-counter insoles and oral anti-inflammatories without relief.  Has good digital perfusion well oriented x3     Assessment:  Acute plantar fasciitis heel bilateral     Plan:  H&P x-rays reviewed with patient and today I injected the plantar fascial bilateral 3 mg Kenalog 5 mg Xylocaine and applied fascial brace bilateral.  I gave instructions on aggressive ice and supportive shoes and discussed long-term orthotics and patient will be seen back again to reevaluate the results in the next 2 weeks  X-rays indicate that there is small spur formation bilateral heel with cavus foot structure and also patient is placed on diclofenac 75 mg twice daily

## 2018-02-05 ENCOUNTER — Telehealth: Payer: Self-pay | Admitting: Podiatry

## 2018-02-05 NOTE — Telephone Encounter (Signed)
I saw Dr. Charlsie Merles yesterday. He gave me a brace for both ankles and I was told not to wear them when I sleep at night. I forgot to ask if I can wear them when I work out? I do a Zumba class and wanted to know. You can reach me at [phone]. Thank you.

## 2018-02-05 NOTE — Telephone Encounter (Signed)
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

## 2018-02-11 DIAGNOSIS — F331 Major depressive disorder, recurrent, moderate: Secondary | ICD-10-CM | POA: Diagnosis not present

## 2018-02-18 ENCOUNTER — Ambulatory Visit: Payer: BLUE CROSS/BLUE SHIELD | Admitting: Podiatry

## 2018-02-22 ENCOUNTER — Ambulatory Visit: Payer: BLUE CROSS/BLUE SHIELD | Admitting: Podiatry

## 2018-02-22 ENCOUNTER — Encounter: Payer: Self-pay | Admitting: Podiatry

## 2018-02-22 DIAGNOSIS — M545 Low back pain, unspecified: Secondary | ICD-10-CM | POA: Insufficient documentation

## 2018-02-22 DIAGNOSIS — M722 Plantar fascial fibromatosis: Secondary | ICD-10-CM

## 2018-02-22 DIAGNOSIS — N952 Postmenopausal atrophic vaginitis: Secondary | ICD-10-CM | POA: Insufficient documentation

## 2018-02-22 DIAGNOSIS — A6000 Herpesviral infection of urogenital system, unspecified: Secondary | ICD-10-CM | POA: Insufficient documentation

## 2018-02-24 NOTE — Progress Notes (Signed)
Subjective:   Patient ID: Joann Smith, female   DOB: 57 y.o.   MRN: [id number]   HPI Patient states of having some improvement of my heels but still pain and I have had this for 2 years now and so far this is the best it is felt over that period of time   ROS      Objective:  Physical Exam  Neurovascular status intact with patient's heels still painful right over left but improved quite a bit from previous visit with suppression of the arch noted and discomfort mostly after periods of sitting and when getting up in the morning     Assessment:  Fasciitis symptomatology present but improving with biomechanical causes of condition     Plan:  H&P conditions reviewed and at this point I have recommended long-term orthotic to support the plantar arch and patient was scanned for customized orthotic devices.  I then went ahead and dispensed one night splint that she can use on both feet and explained the usage of this and also aggressive ice therapy.  May require other treatments depending on response to this continue conservative regimen we are undergoing

## 2018-02-25 DIAGNOSIS — F331 Major depressive disorder, recurrent, moderate: Secondary | ICD-10-CM | POA: Diagnosis not present

## 2018-03-04 DIAGNOSIS — F331 Major depressive disorder, recurrent, moderate: Secondary | ICD-10-CM | POA: Diagnosis not present

## 2018-03-08 ENCOUNTER — Encounter: Payer: Self-pay | Admitting: Podiatry

## 2018-03-08 ENCOUNTER — Other Ambulatory Visit: Payer: BLUE CROSS/BLUE SHIELD | Admitting: Orthotics

## 2018-03-08 ENCOUNTER — Ambulatory Visit: Payer: BLUE CROSS/BLUE SHIELD | Admitting: Podiatry

## 2018-03-08 DIAGNOSIS — M722 Plantar fascial fibromatosis: Secondary | ICD-10-CM | POA: Diagnosis not present

## 2018-03-08 MED ORDER — TRIAMCINOLONE ACETONIDE 10 MG/ML IJ SUSP
10.0000 mg | Freq: Once | INTRAMUSCULAR | Status: AC
  Administered 2018-03-08: 10 mg

## 2018-03-09 NOTE — Progress Notes (Signed)
Subjective:   Patient ID: Joann Smith, female   DOB: 57 y.o.   MRN: [id number]   HPI Patient states my feet have started to hurt again and I just picked up my orthotics   ROS      Objective:  Physical Exam  Neurovascular status intact with discomfort plantar heel region bilateral with inflammation fluid noted that is localized to the heel themselves with well fitted orthotics     Assessment:  Reoccurrence plantar fasciitis bilateral     Plan:  Went ahead and reinjected the plantar fascial bilateral 3 mg Kenalog 5 mg Xylocaine and instructed on beginning of orthotic therapy and ice therapy.  Reappoint to recheck

## 2018-03-24 DIAGNOSIS — F331 Major depressive disorder, recurrent, moderate: Secondary | ICD-10-CM | POA: Diagnosis not present

## 2018-03-25 ENCOUNTER — Other Ambulatory Visit: Payer: BLUE CROSS/BLUE SHIELD | Admitting: Orthotics

## 2018-04-05 DIAGNOSIS — F321 Major depressive disorder, single episode, moderate: Secondary | ICD-10-CM | POA: Diagnosis not present

## 2018-04-14 ENCOUNTER — Encounter: Payer: Self-pay | Admitting: Podiatry

## 2018-04-14 ENCOUNTER — Ambulatory Visit: Payer: BLUE CROSS/BLUE SHIELD | Admitting: Podiatry

## 2018-04-14 DIAGNOSIS — M722 Plantar fascial fibromatosis: Secondary | ICD-10-CM | POA: Diagnosis not present

## 2018-04-15 DIAGNOSIS — F331 Major depressive disorder, recurrent, moderate: Secondary | ICD-10-CM | POA: Diagnosis not present

## 2018-04-15 NOTE — Progress Notes (Signed)
Subjective:   Patient ID: Joann Smith, female   DOB: 57 y.o.   MRN: [id number]   HPI Patient states have improved some but it still bothers me a lot when I get up in the morning and after periods of sitting   ROS      Objective:  Physical Exam  Neurovascular status intact with continued discomfort plantar aspect right heel at the insertional point tendon calcaneus with moderate discomfort on the left     Assessment:  Continuation plantar fasciitis especially worse when getting up in the morning after periods of sitting     Plan:  H&P condition reviewed and recommended physical therapy continued shoe gear modifications and dispensed night splint with all instructions on usage and utilization of ice therapy.  Reappoint 4 weeks to recheck

## 2018-04-29 DIAGNOSIS — F331 Major depressive disorder, recurrent, moderate: Secondary | ICD-10-CM | POA: Diagnosis not present

## 2018-05-11 DIAGNOSIS — H0100B Unspecified blepharitis left eye, upper and lower eyelids: Secondary | ICD-10-CM | POA: Diagnosis not present

## 2018-05-11 DIAGNOSIS — H04123 Dry eye syndrome of bilateral lacrimal glands: Secondary | ICD-10-CM | POA: Diagnosis not present

## 2018-05-11 DIAGNOSIS — H0100A Unspecified blepharitis right eye, upper and lower eyelids: Secondary | ICD-10-CM | POA: Diagnosis not present

## 2018-05-12 ENCOUNTER — Encounter: Payer: Self-pay | Admitting: Podiatry

## 2018-05-12 ENCOUNTER — Ambulatory Visit: Payer: BLUE CROSS/BLUE SHIELD | Admitting: Podiatry

## 2018-05-12 DIAGNOSIS — M722 Plantar fascial fibromatosis: Secondary | ICD-10-CM

## 2018-05-13 ENCOUNTER — Other Ambulatory Visit: Payer: BLUE CROSS/BLUE SHIELD

## 2018-05-13 DIAGNOSIS — F331 Major depressive disorder, recurrent, moderate: Secondary | ICD-10-CM | POA: Diagnosis not present

## 2018-05-13 NOTE — Progress Notes (Signed)
Subjective:   Patient ID: Joann Smith, female   DOB: 57 y.o.   MRN: [id number]   HPI Patient presents stating she is still getting intense heel pain right over left at the insertion of the tendon into the calcaneus.  States he continues to give her problems and make hard for her to walk and it is not responding to treatment we have done so far and she admits she is been doing direct ice to her heel   ROS      Objective:  Physical Exam  Neurovascular status intact with patient found to have intense discomfort plantar fascial right at the insertion calcaneus with moderate pain on the left with some discoloration of the skin where I think she is been icing too much with diminished fat pad noted     Assessment:  Acute plantar fasciitis right over left of a chronic nature also with some skin irritation secondary to direct ice application     Plan:  I reviewed conditions and I have recommended shockwave therapy for this patient.  Patient wants to undergo shockwave therapy and I educated her on this and today I did dispense a air fracture walker that I want her to start to wear right in order to reduce the pressure on the right heel.  Patient will be seen back to recheck for shockwave therapy of the right plantar fascia

## 2018-05-14 DIAGNOSIS — N951 Menopausal and female climacteric states: Secondary | ICD-10-CM | POA: Diagnosis not present

## 2018-05-14 DIAGNOSIS — N6002 Solitary cyst of left breast: Secondary | ICD-10-CM | POA: Diagnosis not present

## 2018-05-14 DIAGNOSIS — Z6823 Body mass index (BMI) 23.0-23.9, adult: Secondary | ICD-10-CM | POA: Diagnosis not present

## 2018-05-18 ENCOUNTER — Ambulatory Visit: Payer: Self-pay

## 2018-05-18 DIAGNOSIS — B351 Tinea unguium: Secondary | ICD-10-CM

## 2018-05-18 DIAGNOSIS — M722 Plantar fascial fibromatosis: Secondary | ICD-10-CM

## 2018-05-19 NOTE — Progress Notes (Signed)
Patient is here today with complaint of pain in her right heel, ongoing from over a year.  She is tried night splints orthotics, injections, and stretching.  She has not had any relief in her pain so far.  Pain on palpation to plantar heel mid to medial aspect at insertional calcaneus.  ESWT administered at 6.3 J and tolerated well.  Advised her to avoid NSAIDs and ice, she is to utilize her boot posttreatment.  Follow-up next week for second treatment.

## 2018-05-25 ENCOUNTER — Ambulatory Visit: Payer: Self-pay

## 2018-05-25 DIAGNOSIS — F331 Major depressive disorder, recurrent, moderate: Secondary | ICD-10-CM | POA: Diagnosis not present

## 2018-05-25 DIAGNOSIS — M722 Plantar fascial fibromatosis: Secondary | ICD-10-CM

## 2018-05-25 DIAGNOSIS — B351 Tinea unguium: Secondary | ICD-10-CM

## 2018-05-28 NOTE — Progress Notes (Signed)
Patient is here today with complaint of pain in her right heel, ongoing from over a year.  She is tried night splints orthotics, injections, and stretching.  She says that she has noticed some relief in pain.   Pain on palpation to plantar heel mid to medial aspect at insertional calcaneus.  ESWT administered at 8.4 J and tolerated well.  Advised her to avoid NSAIDs and ice, she is to utilize her boot posttreatment.  Follow-up next week for second treatment.

## 2018-05-30 DIAGNOSIS — J019 Acute sinusitis, unspecified: Secondary | ICD-10-CM | POA: Diagnosis not present

## 2018-06-01 ENCOUNTER — Other Ambulatory Visit: Payer: BLUE CROSS/BLUE SHIELD

## 2018-06-03 ENCOUNTER — Ambulatory Visit: Payer: Self-pay

## 2018-06-03 DIAGNOSIS — M722 Plantar fascial fibromatosis: Secondary | ICD-10-CM

## 2018-06-03 DIAGNOSIS — M79676 Pain in unspecified toe(s): Secondary | ICD-10-CM

## 2018-06-04 NOTE — Progress Notes (Signed)
Patient is here today with complaint of pain in her right heel, ongoing from over a year.  She is tried night splints orthotics, injections, and stretching.  She says that she has noticed some relief in pain.   Pain on palpation to plantar heel mid to medial aspect at insertional calcaneus.  ESWT administered at 9.5 J and tolerated well.  Advised her to avoid NSAIDs and ice, she is to utilize her boot posttreatment.  Follow-up next week for 3rd treatment.

## 2018-06-08 DIAGNOSIS — F321 Major depressive disorder, single episode, moderate: Secondary | ICD-10-CM | POA: Diagnosis not present

## 2018-06-10 DIAGNOSIS — F331 Major depressive disorder, recurrent, moderate: Secondary | ICD-10-CM | POA: Diagnosis not present

## 2018-06-17 ENCOUNTER — Ambulatory Visit: Payer: Self-pay

## 2018-06-17 DIAGNOSIS — M722 Plantar fascial fibromatosis: Secondary | ICD-10-CM

## 2018-06-17 DIAGNOSIS — B351 Tinea unguium: Secondary | ICD-10-CM

## 2018-06-18 NOTE — Progress Notes (Signed)
Patient is here today with complaint of pain in her right heel, ongoing from over a year.  She is tried night splints orthotics, injections, and stretching.  She says that she has noticed some relief in pain.   Pain on palpation to plantar heel mid to medial aspect at insertional calcaneus.  ESWT administered at 11 J and tolerated well.  Advised her to avoid NSAIDs and ice, she is to utilize her boot posttreatment.  Follow-up next week for 4th treatment.

## 2018-06-30 ENCOUNTER — Ambulatory Visit: Payer: BLUE CROSS/BLUE SHIELD

## 2018-06-30 ENCOUNTER — Other Ambulatory Visit (HOSPITAL_COMMUNITY): Payer: Self-pay | Admitting: Psychiatry

## 2018-06-30 DIAGNOSIS — M722 Plantar fascial fibromatosis: Secondary | ICD-10-CM

## 2018-07-02 NOTE — Progress Notes (Signed)
Patient is here today with complaint of pain in her right heel, ongoing from over a year.  She is tried night splints orthotics, injections, and stretching.  She says that she has noticed some relief in pain.   Pain on palpation to plantar heel mid to medial aspect at insertional calcaneus.  ESWT administered at 6 J and tolerated well. Massager used.  Advised her to avoid NSAIDs and ice, she is to utilize her boot posttreatment.  Follow-up in 2 weeks for 6th treatment.

## 2018-07-08 DIAGNOSIS — F331 Major depressive disorder, recurrent, moderate: Secondary | ICD-10-CM | POA: Diagnosis not present

## 2018-07-13 ENCOUNTER — Ambulatory Visit: Payer: BLUE CROSS/BLUE SHIELD

## 2018-07-13 DIAGNOSIS — M722 Plantar fascial fibromatosis: Secondary | ICD-10-CM

## 2018-07-13 NOTE — Progress Notes (Signed)
Office Visit Note  Patient: Joann Smith             Date of Birth: 11-24-1960           MRN: [id number]             PCP: Daisy Floro, MD Referring: Daisy Floro, MD Visit Date: 07/27/2018 Occupation: @GUAROCC @  Subjective:  Pain in both hands   History of Present Illness: Tayelor Osborne is a 57 y.o. female with history of fibromyalgia, osteoarthritis, and DDD.  She continues to have generalized muscle aches muscle tenderness due to fibromyalgia.  She states 3 months ago she had a fibromyalgia flare that was most severe she has ever had in the past.  She states she continues to have more frequent fibromyalgia flares lately.  She has bilateral trochanteric bursitis and performs her stretching exercises on a regular basis. she continues to be very active and exercises several times a week and performs yoga 3 times weekly.  She continues to see a counselor on a regular basis as well.  She reports that she is bilateral plantar fasciitis and has been seeing Dr. Charlsie Merles.  She has tried orthotics, night boots, and cortisone injections in the past.  She states that she will be following up with him in 1 month to discuss possible surgery.  She denies any Achilles tendinitis.  She has noticed that she is been having increased pain and stiffness in both hands.  She denies any joint swelling.  She states that she is having decreased grip strength and difficulty with fine motor movements.  She continues to take Tarshree and fish oil.  She is Voltaren gel as needed would like a refill.  She takes diclofenac tablets as needed does not notice much improvement.  She reports that she continues to have chronic fatigue and insomnia.  She states she sleeps about 8 hours per night and takes a nap for about 30 minutes daily.  She states she has recently has completed a 7-week mindfulness class.  She practices good sleep hygiene habits.    Activities of Daily Living:  Patient reports morning stiffness for 20-30  minutes.   Patient Reports nocturnal pain.  Difficulty dressing/grooming: Reports Difficulty climbing stairs: Reports Difficulty getting out of chair: Reports Difficulty using hands for taps, buttons, cutlery, and/or writing: Reports  Review of Systems  Constitutional: Positive for fatigue.  HENT: Negative for mouth sores, trouble swallowing, trouble swallowing, mouth dryness and nose dryness.   Eyes: Negative for pain, redness, itching, visual disturbance and dryness.  Respiratory: Negative for cough, hemoptysis, shortness of breath, wheezing and difficulty breathing.   Cardiovascular: Negative for chest pain, palpitations, hypertension and swelling in legs/feet.  Gastrointestinal: Negative for abdominal pain, blood in stool, constipation, diarrhea, nausea and vomiting.  Endocrine: Negative for increased urination.  Genitourinary: Negative for painful urination, nocturia and pelvic pain.  Musculoskeletal: Positive for arthralgias, joint pain and morning stiffness. Negative for joint swelling, myalgias, muscle weakness, muscle tenderness and myalgias.  Skin: Negative for color change, pallor, rash, hair loss, nodules/bumps, redness, skin tightness, ulcers and sensitivity to sunlight.  Allergic/Immunologic: Negative for susceptible to infections.  Neurological: Positive for headaches. Negative for dizziness, light-headedness, numbness and weakness.  Hematological: Negative for bruising/bleeding tendency and swollen glands.  Psychiatric/Behavioral: Negative for depressed mood, confusion and sleep disturbance. The patient is not nervous/anxious.     PMFS History:  Patient Active Problem List   Diagnosis Date Noted  . Atrophic vaginitis 02/22/2018  . Genital  herpes simplex 02/22/2018  . Low back pain 02/22/2018  . Aortic root aneurysm (HCC) 01/19/2018  . Ascending aortic aneurysm (HCC) 01/19/2018  . Fibromyalgia 07/09/2016  . Fatigue 07/09/2016  . Insomnia 07/09/2016  . Osteoarthritis  of both hands 07/09/2016  . Osteoarthritis of both feet 07/09/2016  . Osteoarthritis of both knees 07/09/2016  . Chondromalacia, patella 07/09/2016  . Vitamin D deficiency 07/09/2016  . Pulmonary mass 07/09/2016  . IBS (irritable bowel syndrome) 07/09/2016  . IgA deficiency (HCC) 07/09/2016  . Liver nodule 07/09/2016  . Pedal edema 07/09/2016  . Greater trochanteric bursitis of both hips 07/09/2016  . Bronchiectasis (HCC) 02/11/2016  . History of adenomatous polyp of colon 02/06/2016  . Wheezing 09/13/2015  . Cough 10/11/2014  . Dyspnea 08/17/2014  . Allergic rhinitis 11/23/2008  . Hemoptysis 07/15/2008  . Pneumonia 07/15/2008  . Sebaceous cyst 03/04/2007  . Abnormal involuntary movement 06/12/2005  . Dermatophytosis, nail 05/20/2005  . Calculus, kidney 11/18/2002  . GERD (gastroesophageal reflux disease) 11/18/2002    Past Medical History:  Diagnosis Date  . Chondromalacia, patella 07/09/2016   Moderate  . Enlarged aorta (HCC)   . Fatigue 07/09/2016  . Fibromyalgia 07/09/2016  . IBS (irritable bowel syndrome) 07/09/2016  . IgA deficiency (HCC) 07/09/2016  . Insomnia 07/09/2016  . Liver nodule 07/09/2016  . Osteoarthritis of both feet 07/09/2016  . Osteoarthritis of both hands 07/09/2016   Mild  . Osteoarthritis of both knees 07/09/2016   Moderate  . Osteoporosis   . Pedal edema 07/09/2016  . Pulmonary mass 07/09/2016   F/u with pulmonologist yearly   . Vitamin D deficiency 07/09/2016    Family History  Problem Relation Age of Onset  . Dementia Mother   . Osteoarthritis Mother   . Cancer Father   . Arthritis Sister   . Thyroid disease Sister   . Arthritis Sister   . Diabetes Sister        pre-diabetes   . Depression Son    Past Surgical History:  Procedure Laterality Date  . ABDOMINAL HYSTERECTOMY    . AUGMENTATION MAMMAPLASTY Bilateral   . LUNG REMOVAL, PARTIAL Right 02/2016  . SHOULDER ARTHROSCOPY Left 06/2016   Social History   Social History Narrative  .  Not on file    Objective: Vital Signs: BP 112/64 (BP Location: Left Arm, Patient Position: Sitting, Cuff Size: Normal)   Pulse 71   Resp 13   Ht 6' (1.829 m)   Wt 171 lb (77.6 kg)   LMP 07/10/2010   BMI 23.19 kg/m    Physical Exam  Constitutional: She is oriented to person, place, and time. She appears well-developed and well-nourished.  HENT:  Head: Normocephalic and atraumatic.  Eyes: Conjunctivae and EOM are normal.  Neck: Normal range of motion.  Cardiovascular: Normal rate, regular rhythm, normal heart sounds and intact distal pulses.  Pulmonary/Chest: Effort normal and breath sounds normal.  Abdominal: Soft. Bowel sounds are normal.  Lymphadenopathy:    She has no cervical adenopathy.  Neurological: She is alert and oriented to person, place, and time.  Skin: Skin is warm and dry. Capillary refill takes less than 2 seconds.  Psychiatric: She has a normal mood and affect. Her behavior is normal.  Nursing note and vitals reviewed.    Musculoskeletal Exam: C-spine, thoracic spine, lumbar spine good range of motion.  No midline spinal tenderness.  No SI joint tenderness.  Shoulder joints, elbow joints, wrist joints, MCPs and PIPs, DIPs good range of motion no synovitis.  She is PIP and DIP synovial thickening consistent with osteoarthritis of bilateral hands.  She has synovial thickening of the right second and third MCP joints.  Hip joints, knee joints, ankle joints, MTPs and PIPs, DIPs good range of motion no synovitis.  No warmth or effusion bilateral knee joints.  No tenderness or swelling of ankle joints. tenderness of bilateral trochanter bursa.  CDAI Exam: CDAI Score: Not documented Patient Global Assessment: Not documented; Provider Global Assessment: Not documented Swollen: Not documented; Tender: Not documented Joint Exam   Not documented   There is currently no information documented on the homunculus. Go to the Rheumatology activity and complete the homunculus  joint exam.  Investigation: No additional findings.  Imaging: No results found.  Recent Labs: No results found for: WBC, HGB, PLT, NA, K, CL, CO2, GLUCOSE, BUN, CREATININE, BILITOT, ALKPHOS, AST, ALT, PROT, ALBUMIN, CALCIUM, GFRAA, QFTBGOLD, QFTBGOLDPLUS  Speciality Comments: No specialty comments available.  Procedures:  No procedures performed Allergies: Pregabalin; Augmentin [amoxicillin-pot clavulanate]; Cymbalta [duloxetine hcl]; Flexeril [cyclobenzaprine]; Levaquin [levofloxacin]; Sulfa antibiotics; Zofran [ondansetron hcl]; and Omeprazole   Assessment / Plan:     Visit Diagnoses: Fibromyalgia: She continues have generalized muscle aches and muscle tenderness due to fibromyalgia.  She had a fibromyalgia flare 3 months ago that was most severe flare she never had.  She feels as though the flare was due to stress of her mother passing away.  She continues to have frequent flares.  She has tenderness of bilateral trochanteric bursa on exam.  She continues to have chronic fatigue and insomnia.  She has been sleeping about 8 hours a night.  Good sleep hygiene was discussed.  She has been exercising on a regular basis as well as going to yoga 3 times a week.  She recently completed a 7-week mindfulness course and continues to see a counselor on a regular basis.  She has been taking tart cherry and fish oil to help with inflammation.  She uses Voltaren gel as needed.  A refill of Voltaren gel was sent to the pharmacy today.  She is encouraged to stay active and exercise on a regular basis.  She declined physical therapy at this time.  We discussed trying to go to massage therapy once monthly.  Primary insomnia: We discussed the importance of good sleep hygiene habits.  She has been getting about 8 hours of sleep at night and continues to nap about 20 to 30 minutes on a daily basis.  She reports that she wakes up feeling rested but in the afternoon she has daytime drowsiness.  Other fatigue: She  has chronic fatigue.  She was encouraged to stay active and exercise on a regular basis per  Primary osteoarthritis of both hands: She has PIP and DIP synovial thickening consistent with osteoarthritis of bilateral hands.  She is complete fist formation bilaterally.  No synovitis was noted.  She has been having increased discomfort and stiffness in both hands.  She has been having increased difficulty with grip strength as well as fine motor movements.  We discussed the importance of joint protection and muscle strengthening.  She is given a handout of hand exercises that she can perform at home.  She was advised to notify us if she notes increased joint pain or joint swelling  Primary osteoarthritis of both knees: No warmth or effusion.  Good range of motion with no discomfort.  She has bilateral knee crepitus.  Primary osteoarthritis of both feet: She has PIP and DIP synovial thickening  consistent with zoster arthritis of bilateral feet.  She is bilateral plantar fascitis.  She is been seeing Dr. Charlsie Merles on a regular basis.  Chondromalacia of patella, unspecified laterality  Greater trochanteric bursitis of both hips: She has tenderness of bilateral trochanteric bursa on exam.  She performs stretching exercises on a regular basis.  She does yoga 3 times weekly.  DDD (degenerative disc disease), lumbar: No midline spinal tenderness.  No symptoms of sciatica at this time.  History of anxiety: She sees a Veterinary surgeon on a regular basis.  History of depression: She sees a Veterinary surgeon a regular basis.  Other medical conditions are listed as follows:  History of vitamin D deficiency  History of IBS    Orders: No orders of the defined types were placed in this encounter.  Meds ordered this encounter  Medications  . diclofenac sodium (VOLTAREN) 1 % GEL    Sig: Apply 3 grams to 3 large joints up to 3 times daily.    Dispense:  3 Tube    Refill:  3    Face-to-face time spent with patient was 30  minutes. Greater than 50% of time was spent in counseling and coordination of care.  Follow-Up Instructions: Return in about 6 months (around 01/25/2019) for Fibromyalgia, Osteoarthritis, DDD.   Gearldine Bienenstock, PA-C  Note - This record has been created using Dragon software.  Chart creation errors have been sought, but may not always  have been located. Such creation errors do not reflect on  the standard of medical care.

## 2018-07-14 NOTE — Progress Notes (Signed)
Patient is here today with complaint of pain in her right heel, ongoing from over a year.  She is tried night splints orthotics, injections, and stretching.  She says that she has noticed some relief in pain.   Pain on palpation to plantar heel mid to medial aspect at insertional calcaneus.  ESWT administered at 6 J and tolerated well. Massager used.  Advised her to avoid NSAIDs and ice, she is to utilize her boot posttreatment.  Follow-upin 4 weeks with Dr Charlsie Merles if needed

## 2018-07-21 DIAGNOSIS — F331 Major depressive disorder, recurrent, moderate: Secondary | ICD-10-CM | POA: Diagnosis not present

## 2018-07-22 DIAGNOSIS — H524 Presbyopia: Secondary | ICD-10-CM | POA: Diagnosis not present

## 2018-07-22 DIAGNOSIS — H11001 Unspecified pterygium of right eye: Secondary | ICD-10-CM | POA: Diagnosis not present

## 2018-07-22 DIAGNOSIS — H52203 Unspecified astigmatism, bilateral: Secondary | ICD-10-CM | POA: Diagnosis not present

## 2018-07-22 DIAGNOSIS — H5213 Myopia, bilateral: Secondary | ICD-10-CM | POA: Diagnosis not present

## 2018-07-27 ENCOUNTER — Encounter: Payer: Self-pay | Admitting: Physician Assistant

## 2018-07-27 ENCOUNTER — Ambulatory Visit: Payer: BLUE CROSS/BLUE SHIELD | Admitting: Physician Assistant

## 2018-07-27 VITALS — BP 112/64 | HR 71 | Resp 13 | Ht 72.0 in | Wt 171.0 lb

## 2018-07-27 DIAGNOSIS — M797 Fibromyalgia: Secondary | ICD-10-CM

## 2018-07-27 DIAGNOSIS — M19072 Primary osteoarthritis, left ankle and foot: Secondary | ICD-10-CM

## 2018-07-27 DIAGNOSIS — F5101 Primary insomnia: Secondary | ICD-10-CM

## 2018-07-27 DIAGNOSIS — R5383 Other fatigue: Secondary | ICD-10-CM | POA: Diagnosis not present

## 2018-07-27 DIAGNOSIS — Z8639 Personal history of other endocrine, nutritional and metabolic disease: Secondary | ICD-10-CM

## 2018-07-27 DIAGNOSIS — M170 Bilateral primary osteoarthritis of knee: Secondary | ICD-10-CM

## 2018-07-27 DIAGNOSIS — M51369 Other intervertebral disc degeneration, lumbar region without mention of lumbar back pain or lower extremity pain: Secondary | ICD-10-CM

## 2018-07-27 DIAGNOSIS — Z8719 Personal history of other diseases of the digestive system: Secondary | ICD-10-CM

## 2018-07-27 DIAGNOSIS — M7062 Trochanteric bursitis, left hip: Secondary | ICD-10-CM

## 2018-07-27 DIAGNOSIS — Z8659 Personal history of other mental and behavioral disorders: Secondary | ICD-10-CM

## 2018-07-27 DIAGNOSIS — M19071 Primary osteoarthritis, right ankle and foot: Secondary | ICD-10-CM

## 2018-07-27 DIAGNOSIS — M19041 Primary osteoarthritis, right hand: Secondary | ICD-10-CM | POA: Diagnosis not present

## 2018-07-27 DIAGNOSIS — M5136 Other intervertebral disc degeneration, lumbar region: Secondary | ICD-10-CM

## 2018-07-27 DIAGNOSIS — M7061 Trochanteric bursitis, right hip: Secondary | ICD-10-CM

## 2018-07-27 DIAGNOSIS — E559 Vitamin D deficiency, unspecified: Secondary | ICD-10-CM

## 2018-07-27 DIAGNOSIS — M2240 Chondromalacia patellae, unspecified knee: Secondary | ICD-10-CM

## 2018-07-27 DIAGNOSIS — M19042 Primary osteoarthritis, left hand: Secondary | ICD-10-CM

## 2018-07-27 MED ORDER — DICLOFENAC SODIUM 1 % TD GEL: TRANSDERMAL | 3 refills | Status: AC

## 2018-08-03 DIAGNOSIS — F321 Major depressive disorder, single episode, moderate: Secondary | ICD-10-CM | POA: Diagnosis not present

## 2018-08-09 ENCOUNTER — Ambulatory Visit: Payer: BLUE CROSS/BLUE SHIELD | Admitting: Podiatry

## 2018-08-09 ENCOUNTER — Encounter: Payer: Self-pay | Admitting: Podiatry

## 2018-08-09 DIAGNOSIS — M722 Plantar fascial fibromatosis: Secondary | ICD-10-CM | POA: Diagnosis not present

## 2018-08-09 MED ORDER — TRIAMCINOLONE ACETONIDE 10 MG/ML IJ SUSP
10.0000 mg | Freq: Once | INTRAMUSCULAR | Status: AC
  Administered 2018-08-09: 10 mg

## 2018-08-12 DIAGNOSIS — F331 Major depressive disorder, recurrent, moderate: Secondary | ICD-10-CM | POA: Diagnosis not present

## 2018-08-15 NOTE — Progress Notes (Signed)
Subjective:   Patient ID: Joann HamperJudith Smith, female   DOB: 57 y.o.   MRN: [id number]   HPI Patient states that her right heel is about 50% better with shockwave and only mildly hurting her with her left heel is very tender and she is concerned about whether this can heel   ROS      Objective:  Physical Exam  Neurovascular status intact with patient's right heel improved with quite a bit of discomfort in the left plantar fascia     Assessment:  Acute plantar fasciitis left with improving plantar fasciitis secondary to shockwave right     Plan:  Focus on the left heel today and I injected the plantar fascia 3 mg Kenalog 5 mg Xylocaine advised on supportive shoe stretching and reappoint 3 weeks to decide what else may be necessary

## 2018-08-17 DIAGNOSIS — L03012 Cellulitis of left finger: Secondary | ICD-10-CM | POA: Diagnosis not present

## 2018-08-30 ENCOUNTER — Encounter: Payer: Self-pay | Admitting: Podiatry

## 2018-08-30 ENCOUNTER — Ambulatory Visit: Payer: BLUE CROSS/BLUE SHIELD | Admitting: Podiatry

## 2018-08-30 DIAGNOSIS — M722 Plantar fascial fibromatosis: Secondary | ICD-10-CM

## 2018-08-31 NOTE — Progress Notes (Signed)
Subjective:   Patient ID: Joann Smith, female   DOB: 57 y.o.   MRN: [id number]   HPI Patient states foot feels better and states the orthotics seem to feel better and she like to get a new orthotic   ROS      Objective:  Physical Exam  Neurovascular status intact with patient doing well with orthotics holding well and appears to be finally getting over the fascial symptoms she was experiencing     Assessment:  Doing well after having multiple treatments for chronic plantar fasciitis     Plan:  New orthotic will be made and patient will continue with stretching exercises and supportive shoe gear usage.  Reappoint for us to recheck

## 2018-09-07 DIAGNOSIS — K644 Residual hemorrhoidal skin tags: Secondary | ICD-10-CM | POA: Diagnosis not present

## 2018-09-17 DIAGNOSIS — Z1231 Encounter for screening mammogram for malignant neoplasm of breast: Secondary | ICD-10-CM | POA: Diagnosis not present

## 2018-09-17 DIAGNOSIS — Z01419 Encounter for gynecological examination (general) (routine) without abnormal findings: Secondary | ICD-10-CM | POA: Diagnosis not present

## 2018-09-17 DIAGNOSIS — Z6822 Body mass index (BMI) 22.0-22.9, adult: Secondary | ICD-10-CM | POA: Diagnosis not present

## 2018-09-17 DIAGNOSIS — Z124 Encounter for screening for malignant neoplasm of cervix: Secondary | ICD-10-CM | POA: Diagnosis not present

## 2018-09-20 ENCOUNTER — Ambulatory Visit: Payer: BLUE CROSS/BLUE SHIELD | Admitting: Orthotics

## 2018-09-20 DIAGNOSIS — M722 Plantar fascial fibromatosis: Secondary | ICD-10-CM

## 2018-09-20 NOTE — Progress Notes (Signed)
Patient came in today to pick up custom made foot orthotics.  The goals were accomplished and the patient reported no dissatisfaction with said orthotics.  Patient was advised of breakin period and how to report any issues. 

## 2018-09-21 DIAGNOSIS — F330 Major depressive disorder, recurrent, mild: Secondary | ICD-10-CM | POA: Diagnosis not present

## 2018-11-02 DIAGNOSIS — F321 Major depressive disorder, single episode, moderate: Secondary | ICD-10-CM | POA: Diagnosis not present

## 2018-11-10 IMAGING — MG 2D DIGITAL DIAGNOSTIC UNILATERAL LEFT MAMMOGRAM WITH CAD AND ADJ
8 series · 8 of 20 positions shown · non-contrast
Comparison: Prior studies including 08/27/2016

CLINICAL DATA: Palpable abnormality in the left breast. Patient
notes tenderness at the palpable site as well as diffuse left breast
tenderness and left nipple tenderness.

EXAM:
2D DIGITAL DIAGNOSTIC LEFT MAMMOGRAM WITH CAD AND ADJUNCT TOMO
ULTRASOUND LEFT BREAST

[L MLO synth-2D]
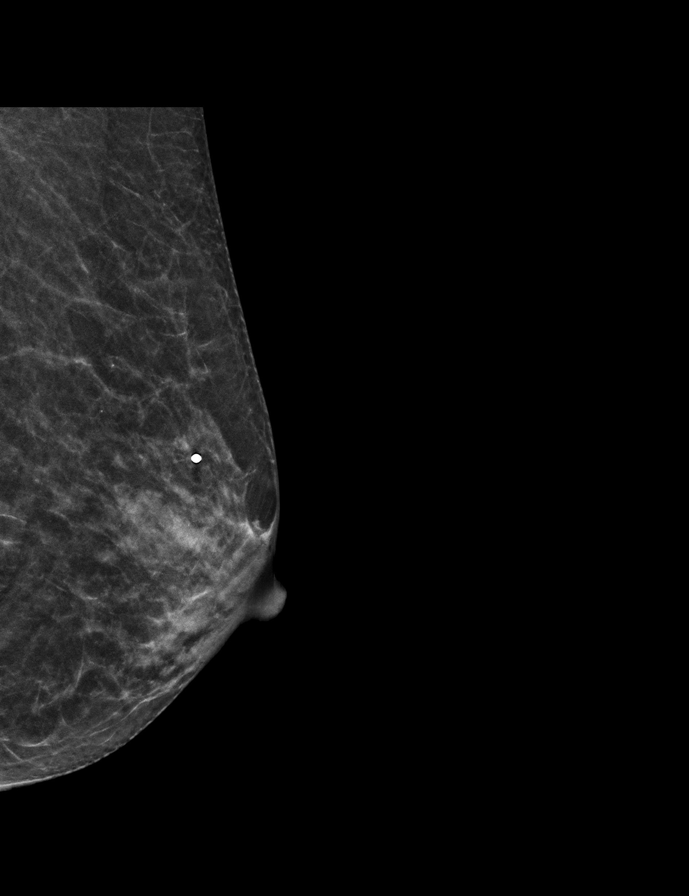

[L MLO]
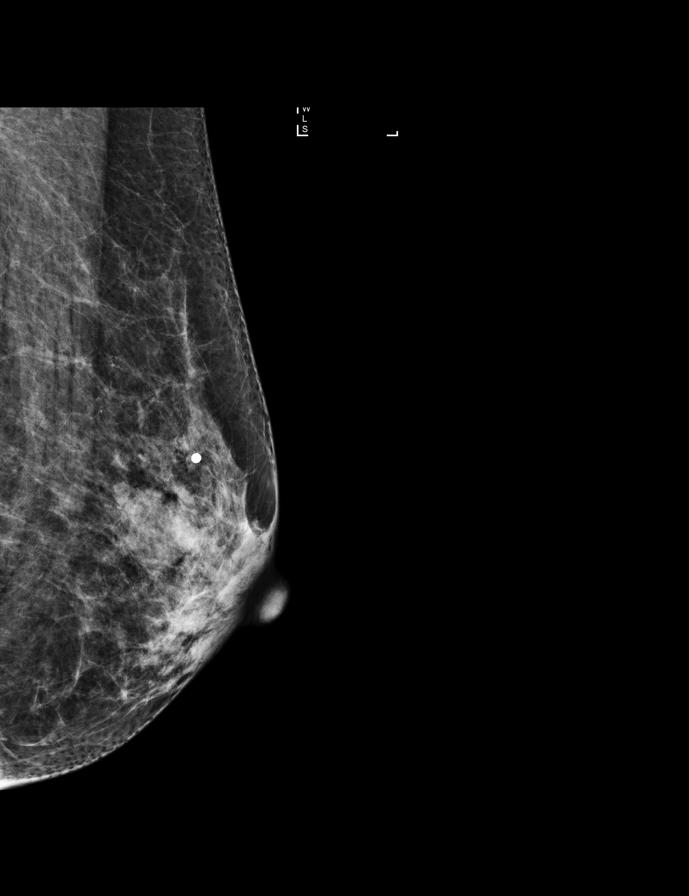

[L ML]
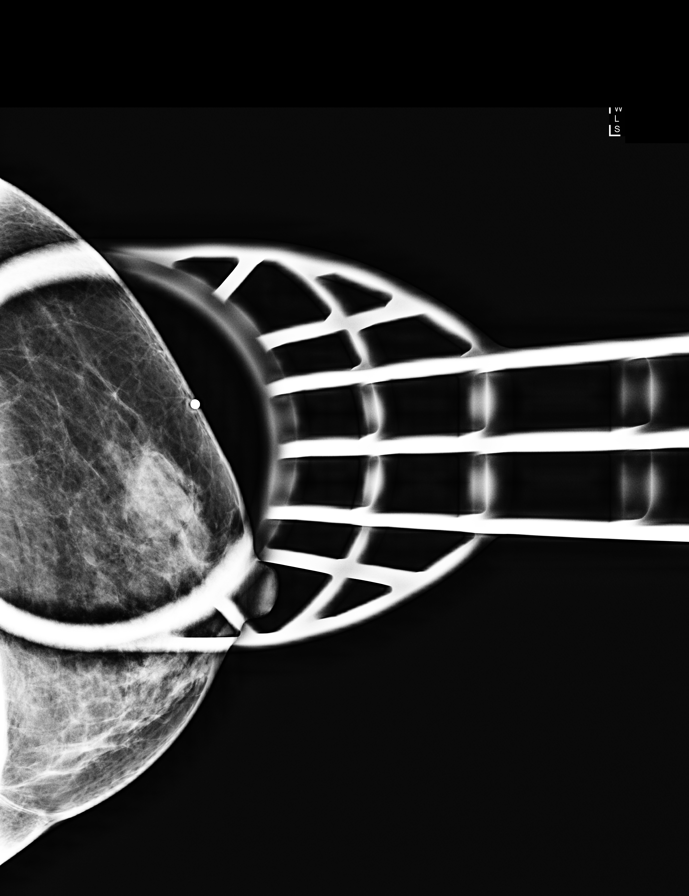

[L CC]
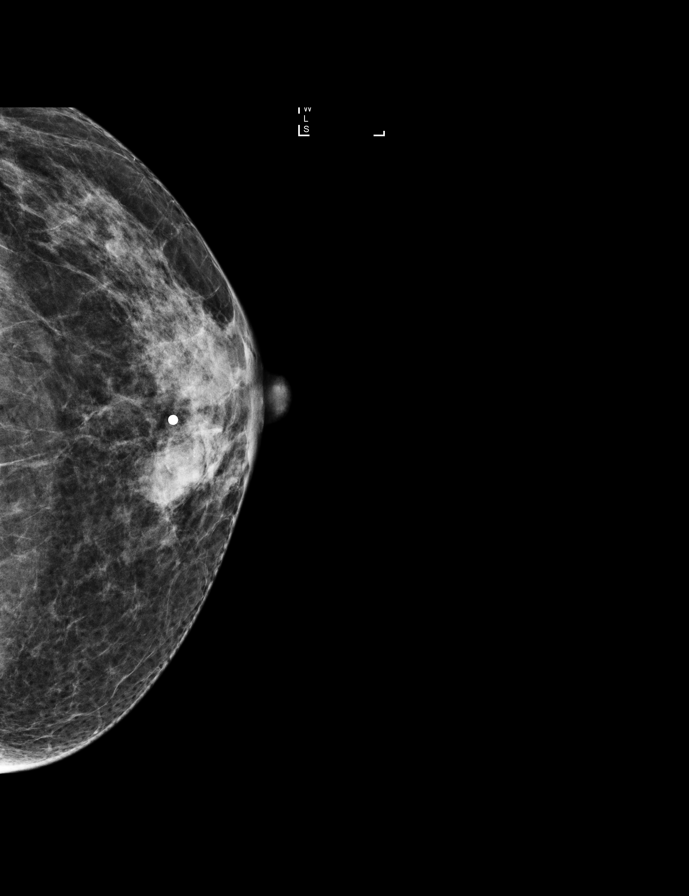

[L CC synth-2D]
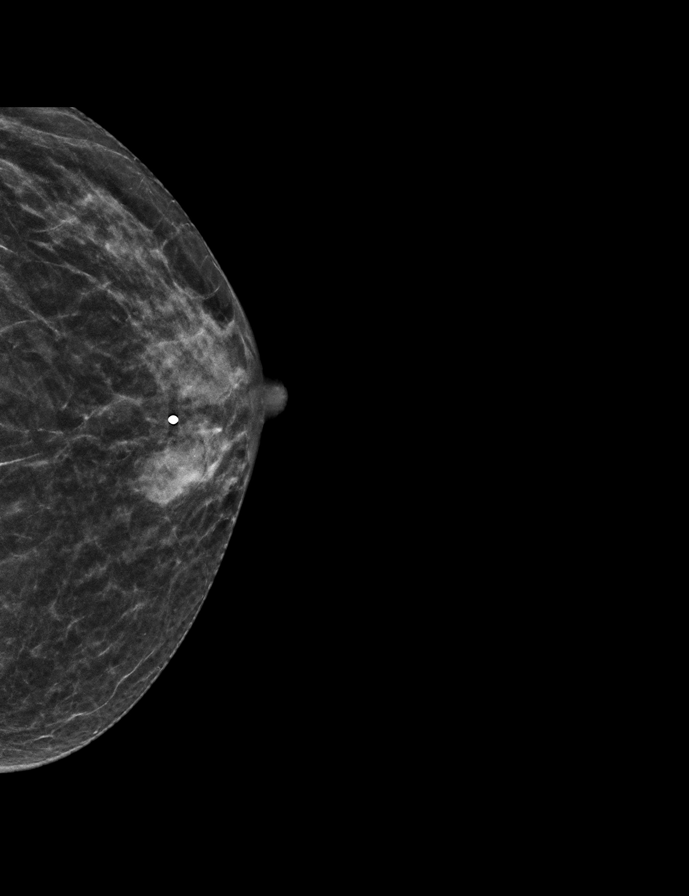

[L ML tomo · tomo slice 21/41.0]
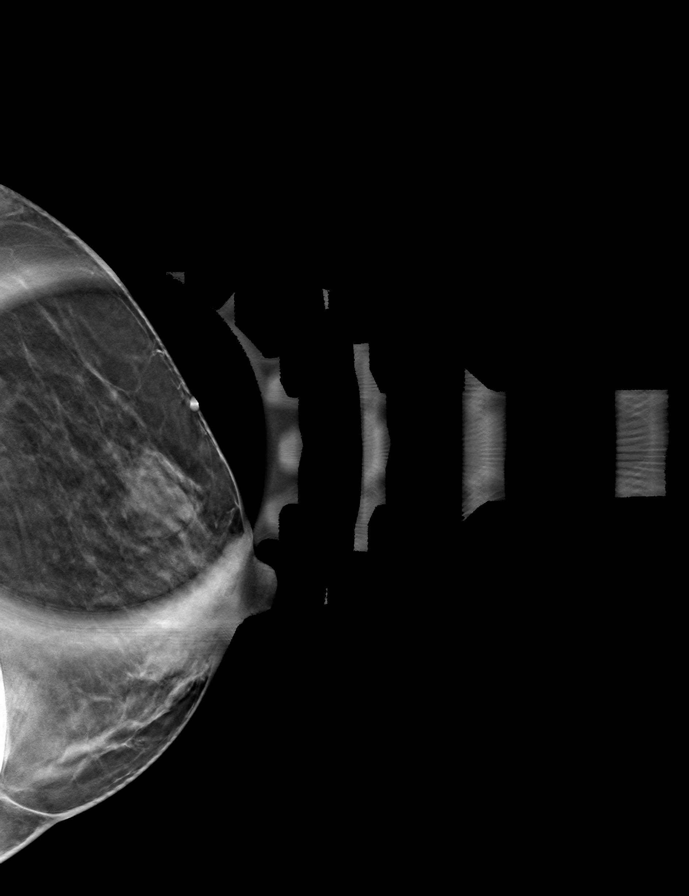

[L CCID BREAST TOMOSYNTHESIS IMAGE tomo · tomo slice 21/40.0]
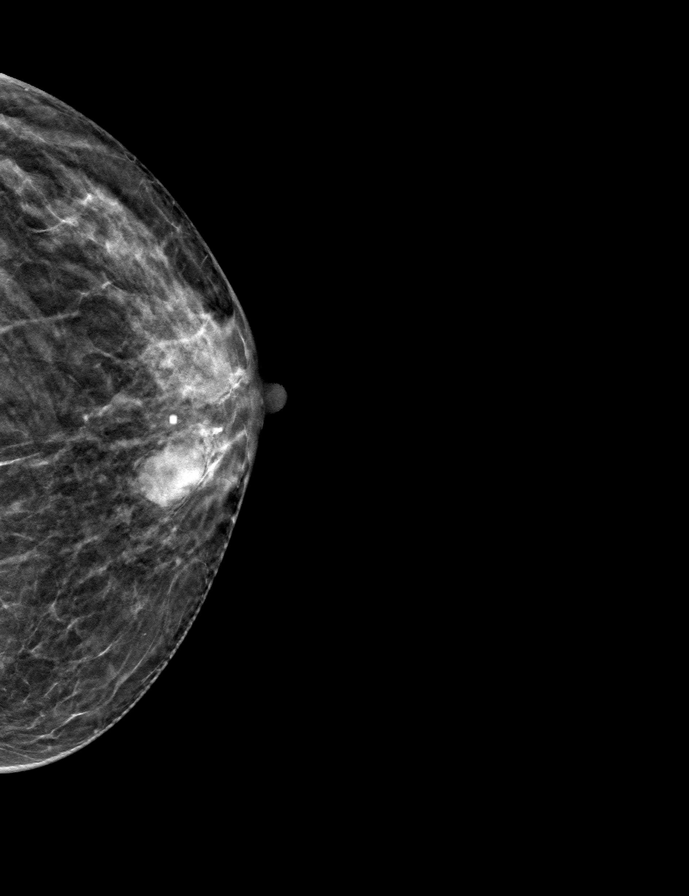

[L MLOID BREAST TOMOSYNTHESIS IMAGE tomo · tomo slice 21/41.0]
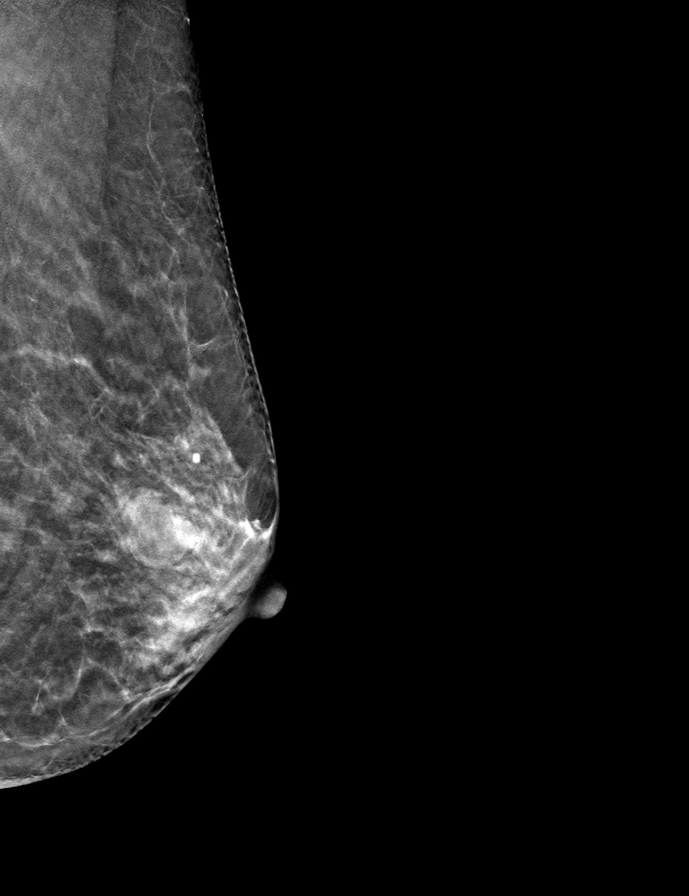

[8 of 20 positions shown; findings below may reference images not displayed]

ACR Breast Density Category c: The breast tissue is heterogeneously
dense, which may obscure small masses.
FINDINGS: There is a partially obscured oval mass in the upper inner quadrant
of the left breast, adjacent to a BB marking the palpable
abnormality. Mass appears stable compared with previous mammogram
from August 2016. No new distortion or suspicious
microcalcifications.

Mammographic images were processed with CAD.

On physical exam, I palpate a rounded mobile mass in the 11 o'clock
location of the left breast 2 cm from the nipple.

Targeted ultrasound is performed, showing a small group of cysts in
the 11 o'clock location of the left breast 2 cm from the nipple
measuring 1.6 x 0.8 x 2.0 cm. No associated acoustic shadowing or
solid components. No other sonographic abnormalities are identified
in the area of patient's concern.
IMPRESSION: Palpable abnormality represents an area of benign cysts in the 11
o'clock location of the left breast. No mammographic or ultrasound
evidence for malignancy. We discussed the option of
ultrasound-guided cyst aspiration as needed.

RECOMMENDATION:
Screening mammogram is recommended in August 2017.

I have discussed the findings and recommendations with the patient.
Results were also provided in writing at the conclusion of the
visit. If applicable, a reminder letter will be sent to the patient
regarding the next appointment.

BI-RADS CATEGORY  2: Benign.

## 2018-11-16 DIAGNOSIS — F330 Major depressive disorder, recurrent, mild: Secondary | ICD-10-CM | POA: Diagnosis not present

## 2018-12-30 ENCOUNTER — Other Ambulatory Visit (HOSPITAL_COMMUNITY): Payer: Self-pay | Admitting: Psychiatry

## 2019-01-11 DIAGNOSIS — F321 Major depressive disorder, single episode, moderate: Secondary | ICD-10-CM | POA: Diagnosis not present

## 2019-01-18 DIAGNOSIS — F330 Major depressive disorder, recurrent, mild: Secondary | ICD-10-CM | POA: Diagnosis not present

## 2019-01-19 NOTE — Progress Notes (Deleted)
Office Visit Note  Patient: Joann Smith             Date of Birth: 1960-11-24           MRN: [id number]             PCP: Daisy Floro, MD Referring: Daisy Floro, MD Visit Date: 01/27/2019 Occupation: @GUAROCC @  Subjective:  No chief complaint on file.   History of Present Illness: Isebella Koser is a 58 y.o. female ***   Activities of Daily Living:  Patient reports morning stiffness for *** {minute/hour:19697}.   Patient {ACTIONS;DENIES/REPORTS:21021675::"Denies"} nocturnal pain.  Difficulty dressing/grooming: {ACTIONS;DENIES/REPORTS:21021675::"Denies"} Difficulty climbing stairs: {ACTIONS;DENIES/REPORTS:21021675::"Denies"} Difficulty getting out of chair: {ACTIONS;DENIES/REPORTS:21021675::"Denies"} Difficulty using hands for taps, buttons, cutlery, and/or writing: {ACTIONS;DENIES/REPORTS:21021675::"Denies"}  No Rheumatology ROS completed.   PMFS History:  Patient Active Problem List   Diagnosis Date Noted  . Atrophic vaginitis 02/22/2018  . Genital herpes simplex 02/22/2018  . Low back pain 02/22/2018  . Aortic root aneurysm (HCC) 01/19/2018  . Ascending aortic aneurysm (HCC) 01/19/2018  . Fibromyalgia 07/09/2016  . Fatigue 07/09/2016  . Insomnia 07/09/2016  . Osteoarthritis of both hands 07/09/2016  . Osteoarthritis of both feet 07/09/2016  . Osteoarthritis of both knees 07/09/2016  . Chondromalacia, patella 07/09/2016  . Vitamin D deficiency 07/09/2016  . Pulmonary mass 07/09/2016  . IBS (irritable bowel syndrome) 07/09/2016  . IgA deficiency (HCC) 07/09/2016  . Liver nodule 07/09/2016  . Pedal edema 07/09/2016  . Greater trochanteric bursitis of both hips 07/09/2016  . Bronchiectasis (HCC) 02/11/2016  . History of adenomatous polyp of colon 02/06/2016  . Wheezing 09/13/2015  . Cough 10/11/2014  . Dyspnea 08/17/2014  . Allergic rhinitis 11/23/2008  . Hemoptysis 07/15/2008  . Pneumonia 07/15/2008  . Sebaceous cyst 03/04/2007  . Abnormal  involuntary movement 06/12/2005  . Dermatophytosis, nail 05/20/2005  . Calculus, kidney 11/18/2002  . GERD (gastroesophageal reflux disease) 11/18/2002    Past Medical History:  Diagnosis Date  . Chondromalacia, patella 07/09/2016   Moderate  . Enlarged aorta (HCC)   . Fatigue 07/09/2016  . Fibromyalgia 07/09/2016  . IBS (irritable bowel syndrome) 07/09/2016  . IgA deficiency (HCC) 07/09/2016  . Insomnia 07/09/2016  . Liver nodule 07/09/2016  . Osteoarthritis of both feet 07/09/2016  . Osteoarthritis of both hands 07/09/2016   Mild  . Osteoarthritis of both knees 07/09/2016   Moderate  . Osteoporosis   . Pedal edema 07/09/2016  . Pulmonary mass 07/09/2016   F/u with pulmonologist yearly   . Vitamin D deficiency 07/09/2016    Family History  Problem Relation Age of Onset  . Dementia Mother   . Osteoarthritis Mother   . Cancer Father   . Arthritis Sister   . Thyroid disease Sister   . Arthritis Sister   . Diabetes Sister        pre-diabetes   . Depression Son    Past Surgical History:  Procedure Laterality Date  . ABDOMINAL HYSTERECTOMY    . AUGMENTATION MAMMAPLASTY Bilateral   . LUNG REMOVAL, PARTIAL Right 02/2016  . SHOULDER ARTHROSCOPY Left 06/2016   Social History   Social History Narrative  . Not on file   Immunization History  Administered Date(s) Administered  . DT 01/12/2003  . Influenza Inj Mdck Quad Pf 06/12/2017  . Influenza Split 07/12/2014  . Influenza, Seasonal, Injecte, Preservative Fre 06/28/2015  . Influenza,inj,Quad PF,6+ Mos 07/02/2018  . Influenza,inj,quad, With Preservative 06/12/2017  . Influenza-Unspecified 07/04/2015  . PPD Test 07/16/2008  Objective: Vital Signs: LMP 07/10/2010    Physical Exam   Musculoskeletal Exam: ***  CDAI Exam: CDAI Score: Not documented Patient Global Assessment: Not documented; Provider Global Assessment: Not documented Swollen: Not documented; Tender: Not documented Joint Exam   Not documented   There  is currently no information documented on the homunculus. Go to the Rheumatology activity and complete the homunculus joint exam.  Investigation: No additional findings.  Imaging: No results found.  Recent Labs: No results found for: WBC, HGB, PLT, NA, K, CL, CO2, GLUCOSE, BUN, CREATININE, BILITOT, ALKPHOS, AST, ALT, PROT, ALBUMIN, CALCIUM, GFRAA, QFTBGOLD, QFTBGOLDPLUS  Speciality Comments: No specialty comments available.  Procedures:  No procedures performed Allergies: Pregabalin; Augmentin [amoxicillin-pot clavulanate]; Cymbalta [duloxetine hcl]; Flexeril [cyclobenzaprine]; Levaquin [levofloxacin]; Sulfa antibiotics; Zofran [ondansetron hcl]; and Omeprazole   Assessment / Plan:     Visit Diagnoses: No diagnosis found.   Orders: No orders of the defined types were placed in this encounter.  No orders of the defined types were placed in this encounter.   Face-to-face time spent with patient was *** minutes. Greater than 50% of time was spent in counseling and coordination of care.  Follow-Up Instructions: No follow-ups on file.   Gearldine Bienenstock, PA-C  Note - This record has been created using Dragon software.  Chart creation errors have been sought, but may not always  have been located. Such creation errors do not reflect on  the standard of medical care.

## 2019-01-25 DIAGNOSIS — E559 Vitamin D deficiency, unspecified: Secondary | ICD-10-CM | POA: Diagnosis not present

## 2019-01-25 DIAGNOSIS — J309 Allergic rhinitis, unspecified: Secondary | ICD-10-CM | POA: Diagnosis not present

## 2019-01-25 DIAGNOSIS — Z0000 Encounter for general adult medical examination without abnormal findings: Secondary | ICD-10-CM | POA: Diagnosis not present

## 2019-01-25 DIAGNOSIS — Z13220 Encounter for screening for lipoid disorders: Secondary | ICD-10-CM | POA: Diagnosis not present

## 2019-01-25 DIAGNOSIS — F331 Major depressive disorder, recurrent, moderate: Secondary | ICD-10-CM | POA: Diagnosis not present

## 2019-01-25 DIAGNOSIS — D802 Selective deficiency of immunoglobulin A [IgA]: Secondary | ICD-10-CM | POA: Diagnosis not present

## 2019-01-27 ENCOUNTER — Ambulatory Visit: Payer: Self-pay | Admitting: Physician Assistant

## 2019-03-03 DIAGNOSIS — F330 Major depressive disorder, recurrent, mild: Secondary | ICD-10-CM | POA: Diagnosis not present

## 2019-03-31 DIAGNOSIS — F330 Major depressive disorder, recurrent, mild: Secondary | ICD-10-CM | POA: Diagnosis not present

## 2019-04-05 ENCOUNTER — Ambulatory Visit: Payer: BLUE CROSS/BLUE SHIELD | Admitting: Rheumatology

## 2019-05-10 DIAGNOSIS — F330 Major depressive disorder, recurrent, mild: Secondary | ICD-10-CM | POA: Diagnosis not present

## 2019-06-07 DIAGNOSIS — Z23 Encounter for immunization: Secondary | ICD-10-CM | POA: Diagnosis not present

## 2019-06-07 DIAGNOSIS — M25551 Pain in right hip: Secondary | ICD-10-CM | POA: Diagnosis not present

## 2019-06-14 DIAGNOSIS — F330 Major depressive disorder, recurrent, mild: Secondary | ICD-10-CM | POA: Diagnosis not present

## 2019-07-19 DIAGNOSIS — F330 Major depressive disorder, recurrent, mild: Secondary | ICD-10-CM | POA: Diagnosis not present

## 2019-08-16 DIAGNOSIS — F331 Major depressive disorder, recurrent, moderate: Secondary | ICD-10-CM | POA: Diagnosis not present

## 2019-08-29 DIAGNOSIS — Z23 Encounter for immunization: Secondary | ICD-10-CM | POA: Diagnosis not present

## 2019-09-15 DIAGNOSIS — F331 Major depressive disorder, recurrent, moderate: Secondary | ICD-10-CM | POA: Diagnosis not present

## 2019-11-12 ENCOUNTER — Ambulatory Visit: Payer: Self-pay | Attending: Internal Medicine

## 2019-11-12 DIAGNOSIS — Z23 Encounter for immunization: Secondary | ICD-10-CM | POA: Insufficient documentation

## 2019-11-12 NOTE — Progress Notes (Signed)
   Covid-19 Vaccination Clinic  Name:  Joann Smith    MRN: [id number] DOB: 1961/06/27  11/12/2019  [name] was observed post Covid-19 immunization for 15 minutes without incident. She was provided with Vaccine Information Sheet and instruction to access the V-Safe system.   [name] was instructed to call 911 with any severe reactions post vaccine: Marland Kitchen Difficulty breathing  . Swelling of face and throat  . A fast heartbeat  . A bad rash all over body  . Dizziness and weakness   Immunizations Administered    Name Date Dose VIS Date Route   Pfizer COVID-19 Vaccine 11/12/2019  8:41 AM 0.3 mL 08/19/2019 Intramuscular   Manufacturer: [employer], [employer]   Lot: [id number]   NDC: [address]-4

## 2019-12-03 ENCOUNTER — Ambulatory Visit: Payer: Self-pay | Attending: Internal Medicine

## 2019-12-03 DIAGNOSIS — Z23 Encounter for immunization: Secondary | ICD-10-CM

## 2019-12-03 NOTE — Progress Notes (Signed)
   Covid-19 Vaccination Clinic  Name:  Joann Smith    MRN: [id number] DOB: [birth year]  12/03/2019  Joann Smith was observed post Covid-19 immunization for 15 minutes without incident. She was provided with Vaccine Information Sheet and instruction to access the V-Safe system.   Joann Smith was instructed to call 911 with any severe reactions post vaccine: Marland Kitchen Difficulty breathing  . Swelling of face and throat  . A fast heartbeat  . A bad rash all over body  . Dizziness and weakness   Immunizations Administered    Name Date Dose VIS Date Route   Pfizer COVID-19 Vaccine 12/03/2019  9:38 AM 0.3 mL 08/19/2019 Intramuscular   Manufacturer: [employer], [employer]   Lot: [id number]   NDC: [address]-4

## 2020-01-24 DIAGNOSIS — I712 Thoracic aortic aneurysm, without rupture: Secondary | ICD-10-CM | POA: Diagnosis not present

## 2020-01-25 DIAGNOSIS — Z0000 Encounter for general adult medical examination without abnormal findings: Secondary | ICD-10-CM | POA: Diagnosis not present

## 2020-01-25 DIAGNOSIS — Z13220 Encounter for screening for lipoid disorders: Secondary | ICD-10-CM | POA: Diagnosis not present

## 2020-01-25 DIAGNOSIS — E559 Vitamin D deficiency, unspecified: Secondary | ICD-10-CM | POA: Diagnosis not present

## 2020-01-26 DIAGNOSIS — Z0000 Encounter for general adult medical examination without abnormal findings: Secondary | ICD-10-CM | POA: Diagnosis not present

## 2020-02-03 DIAGNOSIS — Z87898 Personal history of other specified conditions: Secondary | ICD-10-CM | POA: Diagnosis not present

## 2020-02-03 DIAGNOSIS — Z6822 Body mass index (BMI) 22.0-22.9, adult: Secondary | ICD-10-CM | POA: Diagnosis not present

## 2020-02-03 DIAGNOSIS — Z1231 Encounter for screening mammogram for malignant neoplasm of breast: Secondary | ICD-10-CM | POA: Diagnosis not present

## 2020-02-03 DIAGNOSIS — Z01419 Encounter for gynecological examination (general) (routine) without abnormal findings: Secondary | ICD-10-CM | POA: Diagnosis not present

## 2020-05-31 DIAGNOSIS — H93293 Other abnormal auditory perceptions, bilateral: Secondary | ICD-10-CM | POA: Diagnosis not present

## 2020-06-27 DIAGNOSIS — F331 Major depressive disorder, recurrent, moderate: Secondary | ICD-10-CM | POA: Diagnosis not present

## 2020-07-18 DIAGNOSIS — F331 Major depressive disorder, recurrent, moderate: Secondary | ICD-10-CM | POA: Diagnosis not present

## 2020-07-24 DIAGNOSIS — H524 Presbyopia: Secondary | ICD-10-CM | POA: Diagnosis not present

## 2020-07-24 DIAGNOSIS — H11001 Unspecified pterygium of right eye: Secondary | ICD-10-CM | POA: Diagnosis not present

## 2020-08-06 DIAGNOSIS — F331 Major depressive disorder, recurrent, moderate: Secondary | ICD-10-CM | POA: Diagnosis not present

## 2020-11-05 DIAGNOSIS — F331 Major depressive disorder, recurrent, moderate: Secondary | ICD-10-CM | POA: Diagnosis not present

## 2020-12-20 DIAGNOSIS — M542 Cervicalgia: Secondary | ICD-10-CM | POA: Diagnosis not present

## 2021-01-25 DIAGNOSIS — I719 Aortic aneurysm of unspecified site, without rupture: Secondary | ICD-10-CM | POA: Diagnosis not present

## 2021-01-29 DIAGNOSIS — E559 Vitamin D deficiency, unspecified: Secondary | ICD-10-CM | POA: Diagnosis not present

## 2021-01-29 DIAGNOSIS — Z0000 Encounter for general adult medical examination without abnormal findings: Secondary | ICD-10-CM | POA: Diagnosis not present

## 2021-01-29 DIAGNOSIS — Z13220 Encounter for screening for lipoid disorders: Secondary | ICD-10-CM | POA: Diagnosis not present

## 2021-02-05 DIAGNOSIS — Z1211 Encounter for screening for malignant neoplasm of colon: Secondary | ICD-10-CM | POA: Diagnosis not present

## 2021-02-05 DIAGNOSIS — K5730 Diverticulosis of large intestine without perforation or abscess without bleeding: Secondary | ICD-10-CM | POA: Diagnosis not present

## 2021-02-05 DIAGNOSIS — D123 Benign neoplasm of transverse colon: Secondary | ICD-10-CM | POA: Diagnosis not present

## 2021-02-05 DIAGNOSIS — K635 Polyp of colon: Secondary | ICD-10-CM | POA: Diagnosis not present

## 2021-02-05 DIAGNOSIS — D122 Benign neoplasm of ascending colon: Secondary | ICD-10-CM | POA: Diagnosis not present

## 2021-02-05 DIAGNOSIS — Z86010 Personal history of colonic polyps: Secondary | ICD-10-CM | POA: Diagnosis not present

## 2021-02-06 DIAGNOSIS — Z0000 Encounter for general adult medical examination without abnormal findings: Secondary | ICD-10-CM | POA: Diagnosis not present

## 2021-02-18 ENCOUNTER — Encounter: Payer: Self-pay | Admitting: Cardiology

## 2021-02-18 ENCOUNTER — Other Ambulatory Visit: Payer: Self-pay

## 2021-02-18 ENCOUNTER — Ambulatory Visit: Payer: BC Managed Care – PPO | Admitting: Cardiology

## 2021-02-18 VITALS — BP 116/73 | HR 83 | Temp 97.8°F | Resp 16 | Ht 72.0 in | Wt 170.6 lb

## 2021-02-18 DIAGNOSIS — J479 Bronchiectasis, uncomplicated: Secondary | ICD-10-CM

## 2021-02-18 DIAGNOSIS — I7121 Aneurysm of the ascending aorta, without rupture: Secondary | ICD-10-CM

## 2021-02-18 DIAGNOSIS — R2991 Unspecified symptoms and signs involving the musculoskeletal system: Secondary | ICD-10-CM | POA: Diagnosis not present

## 2021-02-18 DIAGNOSIS — I712 Thoracic aortic aneurysm, without rupture: Secondary | ICD-10-CM

## 2021-02-18 NOTE — Progress Notes (Signed)
Primary Physician/Referring:  Daisy Floro, MD  Patient ID: Joann Smith, female    DOB: 08-23-1961, 60 y.o.   MRN: [id number]  Chief Complaint  Patient presents with   Enlarged Valve   New Patient (Initial Visit)   HPI:    Joann Smith  is a 60 y.o. Caucasian female patient, her husband Mr. Helon Wisinski is also a patient of mine, patient has history of ascending aortic aneurysm, quadricuspid aortic valve, reactive airway disease and history of bronchiectasis SP right lower lobectomy at [facility] in 2017 and was previously being followed by [facility] but now wants to establish with me.  She remains asymptomatic.  Past Medical History:  Diagnosis Date   Chondromalacia, patella 07/09/2016   Moderate   Enlarged aorta (HCC)    Fatigue 07/09/2016   Fibromyalgia 07/09/2016   IBS (irritable bowel syndrome) 07/09/2016   IgA deficiency (HCC) 07/09/2016   Insomnia 07/09/2016   Liver nodule 07/09/2016   Osteoarthritis of both feet 07/09/2016   Osteoarthritis of both hands 07/09/2016   Mild   Osteoarthritis of both knees 07/09/2016   Moderate   Osteoporosis    Pedal edema 07/09/2016   Pulmonary mass 07/09/2016   F/u with pulmonologist yearly    Vitamin D deficiency 07/09/2016   Past Surgical History:  Procedure Laterality Date   ABDOMINAL HYSTERECTOMY     AUGMENTATION MAMMAPLASTY Bilateral    LUNG REMOVAL, PARTIAL Right 02/2016   SHOULDER ARTHROSCOPY Left 06/2016   Family History  Problem Relation Age of Onset   Dementia Mother    Osteoarthritis Mother    Cancer Father    Arthritis Sister    Thyroid disease Sister    Arthritis Sister    Diabetes Sister        pre-diabetes    Depression Son     Social History   Tobacco Use   Smoking status: Former    Years: 5.00    Pack years: 0.00    Types: Cigarettes   Smokeless tobacco: Never   Tobacco comments:    AGE 23's  Substance Use Topics   Alcohol use: Yes    Comment: 1-2 monthly   Marital Status: Married  ROS  Review of  Systems  Cardiovascular:  Negative for chest pain, dyspnea on exertion and leg swelling.  Gastrointestinal:  Negative for melena.  Objective  Blood pressure 116/73, pulse 83, temperature 97.8 F (36.6 C), temperature source Temporal, resp. rate 16, height 6' (1.829 m), weight 170 lb 9.6 oz (77.4 kg), last menstrual period 07/10/2010, SpO2 (!) 83 %. Body mass index is 23.14 kg/m.  Vitals with BMI 02/18/2021 07/27/2018 02/04/2018  Height 6\' 0"  6\' 0"  -  Weight 170 lbs 10 oz 171 lbs -  BMI 23.13 23.19 -  Systolic 116 112  Diastolic 73 64 63  Pulse 83 71 62     Physical Exam Constitutional:      Comments: Marfanoid features noted with wide arm span, long fingers, high arched palate, pectus carinatum (mild)  Neck:     Vascular: No carotid bruit or JVD.  Cardiovascular:     Rate and Rhythm: Normal rate and regular rhythm.     Pulses: Normal pulses and intact distal pulses.     Heart sounds: Normal heart sounds. No murmur heard.   No gallop.  Pulmonary:     Effort: Pulmonary effort is normal.     Breath sounds: Normal breath sounds.  Abdominal:     General: Bowel sounds are normal.  Palpations: Abdomen is soft.  Musculoskeletal:        General: No swelling.  Neurological:     Mental Status: She is alert.     Laboratory examination:   External labs:   Labs 02/05/2021:  Hb 13.7/HCT 41.5, platelets 210.  Normal indicis.  Serum glucose 90 mg, BUN 12, creatinine 0.74, potassium 4.4, CMP normal.  Vitamin D45.0.  Total cholesterol 199, triglycerides 54, HDL 57, LDL 132.  Non-HDL cholesterol 142.  TSH 01/25/2020: Normal.  Medications and allergies   Allergies  Allergen Reactions   Pregabalin Other (See Comments)    jittery   Augmentin [Amoxicillin-Pot Clavulanate]    Cymbalta [Duloxetine Hcl]    Flexeril [Cyclobenzaprine]     headaches   Levaquin [Levofloxacin]     Anxiety, jittery    Sulfa Antibiotics    Zofran [Ondansetron Hcl]     Headache, nausea     Omeprazole     Medication prior to this encounter:   Outpatient Medications Prior to Visit  Medication Sig Dispense Refill   albuterol (PROVENTIL) (2.5 MG/3ML) 0.083% nebulizer solution Inhale into the lungs as needed.      albuterol (VENTOLIN HFA) 108 (90 Base) MCG/ACT inhaler Inhale into the lungs every 6 (six) hours as needed for wheezing or shortness of breath.     b complex vitamins tablet Take 1 tablet by mouth daily.     cholecalciferol (VITAMIN D) 1000 units tablet Take 3,000 Units by mouth daily.      diclofenac (VOLTAREN) 75 MG EC tablet Take 1 tablet (75 mg total) by mouth 2 (two) times daily. (Patient taking differently: Take 75 mg by mouth as needed.) 50 tablet 2   diclofenac sodium (VOLTAREN) 1 % GEL Apply 3 grams to 3 large joints up to 3 times daily. 3 Tube 3   escitalopram (LEXAPRO) 10 MG tablet Take 10 mg daily by mouth.  0   estradiol (ESTRACE) 0.1 MG/GM vaginal cream Place 1 Applicatorful vaginally at bedtime.     FIBER PO Take by mouth.     fluticasone (FLONASE) 50 MCG/ACT nasal spray Place into the nose as needed.      ibuprofen (ADVIL,MOTRIN) 200 MG tablet Take by mouth as needed.      Lactase 9000 units CHEW Chew by mouth as needed.      valACYclovir (VALTREX) 500 MG tablet Take 500 mg by mouth as needed.      Misc Natural Products (TART CHERRY ADVANCED PO) Take by mouth.     MISC NATURAL PRODUCTS PO Take by mouth. Turmeric     Omega-3 Fatty Acids (FISH OIL PO) Take by mouth daily.     Saccharomyces boulardii (FLORASTOR PO) Take by mouth daily.     No facility-administered medications prior to visit.     FINAL MEDICATION AS OF TODAY:   Medications after current encounter Current Outpatient Medications  Medication Instructions   albuterol (PROVENTIL) (2.5 MG/3ML) 0.083% nebulizer solution Inhalation, As needed   albuterol (VENTOLIN HFA) 108 (90 Base) MCG/ACT inhaler Inhalation, Every 6 hours PRN   b complex vitamins tablet 1 tablet, Oral, Daily    cholecalciferol (VITAMIN D) 3,000 Units, Oral, Daily   diclofenac (VOLTAREN) 75 mg, Oral, 2 times daily   diclofenac sodium (VOLTAREN) 1 % GEL Apply 3 grams to 3 large joints up to 3 times daily.   escitalopram (LEXAPRO) 10 mg, Oral, Daily   estradiol (ESTRACE) 0.1 MG/GM vaginal cream 1 Applicatorful, Vaginal, Daily at bedtime   FIBER PO Oral  fluticasone (FLONASE) 50 MCG/ACT nasal spray Nasal, As needed   ibuprofen (ADVIL,MOTRIN) 200 MG tablet Oral, As needed   Lactase 9000 units CHEW Oral, As needed   valACYclovir (VALTREX) 500 mg, Oral, As needed   Radiology:   No results found.  Cardiac Studies:   Cardiac MR 02/19/2017: 1. The left ventricle is normal in cavity size and wall thickness. Global systolic function is normal. The  calculated LV ejection fraction is 64%. There are no regional wall motion abnormalities.  2. The right ventricle is normal in cavity size, wall thickness, and systolic function.  3. Both atria are normal in size.  4. The aortic valve appears to have an extra true raphe, or possibly a false raphe between right and  non-coronary cusps. Hence, the aortic valve is is quadricuspid or is trileaflet with an false raphe. There  is normal opening motion and normal forward velocities without any significant stenosis. There is trivial  aortic regurgitation. There is also mild mitral regurgitation.  5. Delayed enhancement imaging demonstrates no evidence of myocardial infarction, scar or infiltrative  disease.  6.  There is no evidence of an intracardiac thrombus.    Chest MRA   1. The aortic root is mildly increased in caliber with a maximum dimension of 4.2 x 4.0 cm. The tubular  ascending aorta is also mildly dilated with a maximum dimension of 4.0 x 3.9 cm. The aortic arch, and  thoracic descending aorta are normal in diameter. There is no dissection seen.  2. The aortic arch is left sided. There is normal branching of the arch vessels.  3. The main and proximal  branch pulmonary arteries are normal in size.  4. Normal systemic venous connections.   Stress echocardiogram 02/01/2016: Patient exercised for 11 minutes and 2 seconds.  Achieved target heart rate and 13.4 METS.  Normal blood pressure response. Stress echocardiogram was normal without wall motion abnormality. Dilated great vessels, ascending aorta = 4.1 cm; main pulmonary artery normal = 2.2 cm.  EKG:     EKG 02/18/2021: Sinus rhythm with borderline first-degree AV block at rate of 65 bpm, borderline left atrial enlargement, normal axis.  Incomplete right bundle branch block.  Poor R wave progression, probably normal variant.  Single PAC.   Assessment     ICD-10-CM   1. Ascending aortic aneurysm (HCC)  I71.2 EKG 12-Lead    PCV ECHOCARDIOGRAM COMPLETE    2. Marfanoid habitus  R29.91     3. Bronchiectasis without complication (HCC) S/P right lower lobectomy 2017  J47.9 DG Chest 2 View       Medications Discontinued During This Encounter  Medication Reason   Misc Natural Products (TART CHERRY ADVANCED PO) Error   MISC NATURAL PRODUCTS PO Error   Omega-3 Fatty Acids (FISH OIL PO) Error   Saccharomyces boulardii (FLORASTOR PO) Error    No orders of the defined types were placed in this encounter.  Orders Placed This Encounter  Procedures   DG Chest 2 View    Standing Status:   Future    Standing Expiration Date:   04/20/2021    Order Specific Question:   Reason for Exam (SYMPTOM  OR DIAGNOSIS REQUIRED)    Answer:   Bronchiectasis, h/o right lower lobectomy    Order Specific Question:   Preferred imaging location?    Answer:   [facility]    Order Specific Question:   Radiology Contrast Protocol - do NOT remove file path    Answer:   [email or web address]  Order Specific Question:   Is patient pregnant?    Answer:   No   EKG 12-Lead   PCV ECHOCARDIOGRAM COMPLETE    Standing Status:   Future    Standing Expiration  Date:   02/18/2022   Recommendations:   Joann Smith is a 61 y.o. Caucasian female patient, her husband Mr. Rosibel Giacobbe is also a patient of mine, patient has history of ascending aortic aneurysm, quadricuspid aortic valve, reactive airway disease and history of bronchiectasis SP right lower lobectomy at [facility] in 2017 and was previously being followed by [facility] but now wants to establish with me.  She leads a fairly healthy lifestyle.  She is essentially asymptomatic.  Her physical examination is very much suggestive of Marfan syndrome, there is no family history of Marfan's.  However would like to obtain genetic testing as it can make an impact with regard to management of her ascending aortic aneurysm.  Will obtain a chest x-ray in view of prior history of bronchiectasis and right lower lobectomy to establish a baseline.  She also needs an echocardiogram.  I would like to follow her up in 3 months and may need to consider CT chest to follow-up on aortic aneurysm or MRI.  I will consider addition of low-dose beta-blockade and losartan from vascular standpoint.  External labs reviewed, she does have mildly elevated LDL but no other vascular risk factors.    Joann Decamp, MD, [facility] 02/18/2021, 10:54 PM Office: [phone]

## 2021-02-19 ENCOUNTER — Telehealth: Payer: Self-pay | Admitting: Student

## 2021-02-19 NOTE — Telephone Encounter (Signed)
Left a message to try to set up for genetic testing.

## 2021-02-25 ENCOUNTER — Telehealth: Payer: Self-pay

## 2021-02-25 NOTE — Telephone Encounter (Signed)
She can do it at her next office visit, I discussed with Ottis Stain he agrees with that plan.

## 2021-02-25 NOTE — Telephone Encounter (Signed)
Patient called and asked about the genetic testing and wanted to know if she needs to do it sooner than later. Thanks

## 2021-02-26 ENCOUNTER — Ambulatory Visit: Payer: BC Managed Care – PPO

## 2021-02-26 ENCOUNTER — Other Ambulatory Visit: Payer: Self-pay

## 2021-02-26 DIAGNOSIS — I712 Thoracic aortic aneurysm, without rupture: Secondary | ICD-10-CM

## 2021-02-26 DIAGNOSIS — I7121 Aneurysm of the ascending aorta, without rupture: Secondary | ICD-10-CM

## 2021-03-08 NOTE — Progress Notes (Signed)
Called and spoke with patient regarding her echocardiogram results.

## 2021-04-11 DIAGNOSIS — Z9189 Other specified personal risk factors, not elsewhere classified: Secondary | ICD-10-CM | POA: Diagnosis not present

## 2021-04-11 DIAGNOSIS — Z6822 Body mass index (BMI) 22.0-22.9, adult: Secondary | ICD-10-CM | POA: Diagnosis not present

## 2021-04-11 DIAGNOSIS — Z01419 Encounter for gynecological examination (general) (routine) without abnormal findings: Secondary | ICD-10-CM | POA: Diagnosis not present

## 2021-04-11 DIAGNOSIS — Z87898 Personal history of other specified conditions: Secondary | ICD-10-CM | POA: Diagnosis not present

## 2021-04-16 ENCOUNTER — Other Ambulatory Visit: Payer: Self-pay | Admitting: Obstetrics

## 2021-04-16 DIAGNOSIS — Z1231 Encounter for screening mammogram for malignant neoplasm of breast: Secondary | ICD-10-CM

## 2021-04-19 DIAGNOSIS — D485 Neoplasm of uncertain behavior of skin: Secondary | ICD-10-CM | POA: Diagnosis not present

## 2021-04-19 DIAGNOSIS — D2362 Other benign neoplasm of skin of left upper limb, including shoulder: Secondary | ICD-10-CM | POA: Diagnosis not present

## 2021-04-19 DIAGNOSIS — D1801 Hemangioma of skin and subcutaneous tissue: Secondary | ICD-10-CM | POA: Diagnosis not present

## 2021-04-19 DIAGNOSIS — L738 Other specified follicular disorders: Secondary | ICD-10-CM | POA: Diagnosis not present

## 2021-04-19 DIAGNOSIS — D2262 Melanocytic nevi of left upper limb, including shoulder: Secondary | ICD-10-CM | POA: Diagnosis not present

## 2021-04-19 DIAGNOSIS — D225 Melanocytic nevi of trunk: Secondary | ICD-10-CM | POA: Diagnosis not present

## 2021-04-25 ENCOUNTER — Ambulatory Visit: Payer: Self-pay

## 2021-05-10 ENCOUNTER — Ambulatory Visit (INDEPENDENT_AMBULATORY_CARE_PROVIDER_SITE_OTHER): Payer: BC Managed Care – PPO | Admitting: Podiatry

## 2021-05-10 ENCOUNTER — Other Ambulatory Visit: Payer: Self-pay

## 2021-05-10 ENCOUNTER — Ambulatory Visit
Admission: RE | Admit: 2021-05-10 | Discharge: 2021-05-10 | Disposition: A | Discharge status: Home / Self Care | Payer: BC Managed Care – PPO | Source: Ambulatory Visit | Attending: Cardiology | Admitting: Cardiology

## 2021-05-10 ENCOUNTER — Other Ambulatory Visit: Payer: Self-pay | Admitting: Cardiology

## 2021-05-10 ENCOUNTER — Encounter: Payer: Self-pay | Admitting: Podiatry

## 2021-05-10 DIAGNOSIS — M722 Plantar fascial fibromatosis: Secondary | ICD-10-CM | POA: Diagnosis not present

## 2021-05-10 DIAGNOSIS — J479 Bronchiectasis, uncomplicated: Secondary | ICD-10-CM

## 2021-05-10 DIAGNOSIS — B351 Tinea unguium: Secondary | ICD-10-CM | POA: Diagnosis not present

## 2021-05-10 DIAGNOSIS — J449 Chronic obstructive pulmonary disease, unspecified: Secondary | ICD-10-CM | POA: Diagnosis not present

## 2021-05-11 NOTE — Progress Notes (Signed)
CXR PA/LAT 05/10/2021: Underlying COPD. Bronchiectasis S/P RUL lobectomy. Underlying emphysema.

## 2021-05-13 NOTE — Progress Notes (Signed)
Subjective:   Patient ID: Joann Smith, female   DOB: 60 y.o.   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

## 2021-05-15 ENCOUNTER — Other Ambulatory Visit: Payer: Self-pay

## 2021-05-15 ENCOUNTER — Ambulatory Visit
Admission: RE | Admit: 2021-05-15 | Discharge: 2021-05-15 | Disposition: A | Discharge status: Home / Self Care | Payer: BC Managed Care – PPO | Source: Ambulatory Visit | Attending: Obstetrics | Admitting: Obstetrics

## 2021-05-15 DIAGNOSIS — Z1231 Encounter for screening mammogram for malignant neoplasm of breast: Secondary | ICD-10-CM

## 2021-05-21 ENCOUNTER — Telehealth: Payer: Self-pay | Admitting: Podiatry

## 2021-05-21 NOTE — Telephone Encounter (Signed)
Refurbished orthotics in... lvm for pt ok to pick up and that she does have 90.00 bal for the refurbishing of the orthotics.

## 2021-05-22 ENCOUNTER — Ambulatory Visit: Payer: BC Managed Care – PPO | Admitting: Cardiology

## 2021-05-23 ENCOUNTER — Encounter: Payer: Self-pay | Admitting: Cardiology

## 2021-05-23 ENCOUNTER — Ambulatory Visit: Payer: BC Managed Care – PPO | Admitting: Cardiology

## 2021-05-23 ENCOUNTER — Other Ambulatory Visit: Payer: Self-pay

## 2021-05-23 VITALS — BP 107/69 | HR 70 | Temp 97.6°F | Resp 16 | Ht 72.0 in | Wt 169.0 lb

## 2021-05-23 DIAGNOSIS — R2991 Unspecified symptoms and signs involving the musculoskeletal system: Secondary | ICD-10-CM | POA: Diagnosis not present

## 2021-05-23 DIAGNOSIS — I712 Thoracic aortic aneurysm, without rupture, unspecified: Secondary | ICD-10-CM

## 2021-05-23 DIAGNOSIS — J479 Bronchiectasis, uncomplicated: Secondary | ICD-10-CM

## 2021-05-23 DIAGNOSIS — E7800 Pure hypercholesterolemia, unspecified: Secondary | ICD-10-CM

## 2021-05-23 MED ORDER — LOSARTAN POTASSIUM 50 MG PO TABS: 50.0000 mg | ORAL_TABLET | Freq: Every day | ORAL | 3 refills | Status: DC

## 2021-05-23 MED ORDER — ATORVASTATIN CALCIUM 10 MG PO TABS: 10.0000 mg | ORAL_TABLET | Freq: Every day | ORAL | 3 refills | Status: DC

## 2021-05-23 NOTE — Progress Notes (Signed)
Primary Physician/Referring:  Daisy Floro, MD  Patient ID: Joann Smith, female    DOB: Dec 16, 1960, 60 y.o.   MRN: [id number]  Chief Complaint  Patient presents with   Aortic Aneurysm   Follow-up    3 months   HPI:    Joann Smith  is a 60 y.o. Caucasian female patient, her husband Mr. Chakira Jachim is also a patient of mine, patient has history of ascending aortic aneurysm, quadricuspid aortic valve, reactive airway disease and history of bronchiectasis SP right lower lobectomy at [facility] in 2017 and was previously being followed by [facility].  She leads a fairly healthy lifestyle.  She is essentially asymptomatic.  I had seen her about 3 months ago.  Past Medical History:  Diagnosis Date   Chondromalacia, patella 07/09/2016   Moderate   Enlarged aorta (HCC)    Fatigue 07/09/2016   Fibromyalgia 07/09/2016   IBS (irritable bowel syndrome) 07/09/2016   IgA deficiency (HCC) 07/09/2016   Insomnia 07/09/2016   Liver nodule 07/09/2016   Osteoarthritis of both feet 07/09/2016   Osteoarthritis of both hands 07/09/2016   Mild   Osteoarthritis of both knees 07/09/2016   Moderate   Osteoporosis    Pedal edema 07/09/2016   Pulmonary mass 07/09/2016   F/u with pulmonologist yearly    Vitamin D deficiency 07/09/2016   Past Surgical History:  Procedure Laterality Date   ABDOMINAL HYSTERECTOMY     AUGMENTATION MAMMAPLASTY Bilateral    LUNG REMOVAL, PARTIAL Right 02/2016   SHOULDER ARTHROSCOPY Left 06/2016   Family History  Problem Relation Age of Onset   Dementia Mother    Osteoarthritis Mother    Cancer Father    Arthritis Sister    Thyroid disease Sister    Arthritis Sister    Diabetes Sister        pre-diabetes    Depression Son     Social History   Tobacco Use   Smoking status: Former    Packs/day: 0.25    Years: 5.00    Pack years: 1.25    Types: Cigarettes    Quit date: 1980    Years since quitting: 42.7   Smokeless tobacco: Never   Tobacco comments:    AGE  43's - only smoked socially  Substance Use Topics   Alcohol use: Yes    Comment: 1-2 monthly   Marital Status: Married  ROS  Review of Systems  Cardiovascular:  Negative for chest pain, dyspnea on exertion and leg swelling.  Gastrointestinal:  Negative for melena.  Objective  Blood pressure 107/69, pulse 70, temperature 97.6 F (36.4 C), temperature source Temporal, resp. rate 16, height 6' (1.829 m), weight 169 lb (76.7 kg), last menstrual period 07/10/2010, SpO2 98 %. Body mass index is 22.92 kg/m.  Vitals with BMI 05/23/2021 02/18/2021 07/27/2018  Height 6\' 0"  6\' 0"  6\' 0"   Weight 169 lbs 170 lbs 10 oz 171 lbs  BMI 22.92 23.13 23.19  Systolic 107 116  Diastolic 69 73 64  Pulse 70 83 71     Physical Exam Constitutional:      Comments: Marfanoid features noted with wide arm span, long fingers, high arched palate, pectus carinatum (mild)  Neck:     Vascular: No carotid bruit or JVD.  Cardiovascular:     Rate and Rhythm: Normal rate and regular rhythm.     Pulses: Normal pulses and intact distal pulses.     Heart sounds: Normal heart sounds. No murmur heard.  No gallop.  Pulmonary:     Effort: Pulmonary effort is normal.     Breath sounds: Normal breath sounds.  Abdominal:     General: Bowel sounds are normal.     Palpations: Abdomen is soft.  Musculoskeletal:        General: No swelling.  Neurological:     Mental Status: She is alert.     Laboratory examination:   External labs:   Labs 02/05/2021:  Hb 13.7/HCT 41.5, platelets 210.  Normal indicis.  Serum glucose 90 mg, BUN 12, creatinine 0.74, potassium 4.4, CMP normal.  Vitamin D45.0.  Total cholesterol 199, triglycerides 54, HDL 57, LDL 132.  Non-HDL cholesterol 142.  TSH 01/25/2020: Normal.  Medications and allergies   Allergies  Allergen Reactions   Pregabalin Other (See Comments)    jittery   Augmentin [Amoxicillin-Pot Clavulanate]    Cymbalta [Duloxetine Hcl]    Flexeril [Cyclobenzaprine]      headaches   Levaquin [Levofloxacin]     Anxiety, jittery    Sulfa Antibiotics    Zofran [Ondansetron Hcl]     Headache, nausea    Omeprazole     Medication prior to this encounter:   Outpatient Medications Prior to Visit  Medication Sig Dispense Refill   albuterol (PROVENTIL) (2.5 MG/3ML) 0.083% nebulizer solution Inhale into the lungs as needed.      albuterol (VENTOLIN HFA) 108 (90 Base) MCG/ACT inhaler Inhale into the lungs every 6 (six) hours as needed for wheezing or shortness of breath.     b complex vitamins tablet Take 1 tablet by mouth daily.     cholecalciferol (VITAMIN D) 1000 units tablet Take 4,000 Units by mouth daily.     diclofenac (VOLTAREN) 75 MG EC tablet Take 1 tablet (75 mg total) by mouth 2 (two) times daily. (Patient taking differently: Take 75 mg by mouth as needed.) 50 tablet 2   diclofenac sodium (VOLTAREN) 1 % GEL Apply 3 grams to 3 large joints up to 3 times daily. 3 Tube 3   escitalopram (LEXAPRO) 10 MG tablet Take 10 mg daily by mouth.  0   estradiol (ESTRACE) 0.1 MG/GM vaginal cream Place 1 Applicatorful vaginally at bedtime.     FIBER PO Take by mouth.     fluticasone (FLONASE) 50 MCG/ACT nasal spray Place into the nose as needed.      ibuprofen (ADVIL,MOTRIN) 200 MG tablet Take by mouth as needed.      Lactase 9000 units CHEW Chew by mouth as needed.      valACYclovir (VALTREX) 500 MG tablet Take 500 mg by mouth as needed.      No facility-administered medications prior to visit.     FINAL MEDICATION AS OF TODAY:   Medications after current encounter Current Outpatient Medications  Medication Instructions   albuterol (PROVENTIL) (2.5 MG/3ML) 0.083% nebulizer solution Inhalation, As needed   albuterol (VENTOLIN HFA) 108 (90 Base) MCG/ACT inhaler Inhalation, Every 6 hours PRN   atorvastatin (LIPITOR) 10 mg, Oral, Daily   b complex vitamins tablet 1 tablet, Oral, Daily   cholecalciferol (VITAMIN D) 4,000 Units, Oral, Daily   diclofenac (VOLTAREN)  75 mg, Oral, 2 times daily   diclofenac sodium (VOLTAREN) 1 % GEL Apply 3 grams to 3 large joints up to 3 times daily.   escitalopram (LEXAPRO) 10 mg, Oral, Daily   estradiol (ESTRACE) 0.1 MG/GM vaginal cream 1 Applicatorful, Vaginal, Daily at bedtime   FIBER PO Oral   fluticasone (FLONASE) 50 MCG/ACT nasal spray Nasal,  As needed   ibuprofen (ADVIL,MOTRIN) 200 MG tablet Oral, As needed   Lactase 9000 units CHEW Oral, As needed   losartan (COZAAR) 50 mg, Oral, Daily   valACYclovir (VALTREX) 500 mg, Oral, As needed   Radiology:   CXR PA/LAT 05/10/2021: Underlying COPD. Bronchiectasis S/P RUL lobectomy. Underlying emphysema.   Cardiac Studies:   Cardiac MR 02/19/2017: 1. The left ventricle is normal in cavity size and wall thickness. Global systolic function is normal. The  calculated LV ejection fraction is 64%. There are no regional wall motion abnormalities.  2. The right ventricle is normal in cavity size, wall thickness, and systolic function.  3. Both atria are normal in size.  4. The aortic valve appears to have an extra true raphe, or possibly a false raphe between right and  non-coronary cusps. Hence, the aortic valve is is quadricuspid or is trileaflet with an false raphe. There  is normal opening motion and normal forward velocities without any significant stenosis. There is trivial  aortic regurgitation. There is also mild mitral regurgitation.  5. Delayed enhancement imaging demonstrates no evidence of myocardial infarction, scar or infiltrative  disease.  6.  There is no evidence of an intracardiac thrombus.    Chest MRA 02/19/2017  1. The aortic root is mildly increased in caliber with a maximum dimension of 4.2 x 4.0 cm. The tubular  ascending aorta is also mildly dilated with a maximum dimension of 4.0 x 3.9 cm. The aortic arch, and  thoracic descending aorta are normal in diameter. There is no dissection seen.  2. The aortic arch is left sided. There is normal  branching of the arch vessels.  3. The main and proximal branch pulmonary arteries are normal in size.  4. Normal systemic venous connections.   Stress echocardiogram 02/01/2016: Patient exercised for 11 minutes and 2 seconds.  Achieved target heart rate and 13.4 METS.  Normal blood pressure response. Stress echocardiogram was normal without wall motion abnormality. Dilated great vessels, ascending aorta = 4.1 cm; main pulmonary artery normal = 2.2 cm.  PCV ECHOCARDIOGRAM COMPLETE 02/26/2021 Left ventricle cavity is normal in size and wall thickness. Normal global wall motion. Normal LV systolic function with EF 67%. Normal diastolic filling pattern. The aortic root and prox ascending aorta is mildly dilated at 3.8 cm. Mild (Grade I) mitral regurgitation. Mild tricuspid regurgitation. No evidence of pulmonary hypertension.     EKG:   EKG 02/18/2021: Sinus rhythm with borderline first-degree AV block at rate of 65 bpm, borderline left atrial enlargement, normal axis.  Incomplete right bundle branch block.  Poor R wave progression, probably normal variant.  Single PAC.   Assessment     ICD-10-CM   1. Thoracic aortic aneurysm without rupture (HCC)  I71.2 CT ANGIO CHEST AORTA W/CM & OR WO/CM    losartan (COZAAR) 50 MG tablet    Basic metabolic panel    2. Marfanoid habitus  R29.91 CT ANGIO CHEST AORTA W/CM & OR WO/CM    3. Bronchiectasis without complication (HCC) S/P right lower lobectomy 2017  J47.9     4. Hypercholesteremia  E78.00 atorvastatin (LIPITOR) 10 MG tablet    Lipid Panel With LDL/HDL Ratio    Lipid Panel With LDL/HDL Ratio       There are no discontinued medications.   Meds ordered this encounter  Medications   losartan (COZAAR) 50 MG tablet    Sig: Take 1 tablet (50 mg total) by mouth daily.    Dispense:  90 tablet  Refill:  3   atorvastatin (LIPITOR) 10 MG tablet    Sig: Take 1 tablet (10 mg total) by mouth daily.    Dispense:  90 tablet    Refill:  3     Orders Placed This Encounter  Procedures   CT ANGIO CHEST AORTA W/CM & OR WO/CM    Standing Status:   Future    Standing Expiration Date:   05/23/2022    Order Specific Question:   If indicated for the ordered procedure, I authorize the administration of contrast media per Radiology protocol    Answer:   Yes    Order Specific Question:   Preferred imaging location?    Answer:   [id number]    Order Specific Question:   Is patient pregnant?    Answer:   No   Lipid Panel With LDL/HDL Ratio    Standing Status:   Future    Number of Occurrences:   1    Standing Expiration Date:   05/23/2022   Basic metabolic panel    Recommendations:   Joann Smith is a 60 y.o. Caucasian female patient, her husband Mr. Meline Russaw is also a patient of mine, patient has history of ascending aortic aneurysm, quadricuspid aortic valve, reactive airway disease and history of bronchiectasis SP right lower lobectomy at [facility] in 2017 and was previously being followed by [facility].  She leads a fairly healthy lifestyle.  She is essentially asymptomatic.  I had seen her about 3 months ago.  Her physical examination is very much suggestive of Marfan syndrome, there is no family history of Marfan's.  However would like to obtain genetic testing as it can make an impact with regard to management of her ascending aortic aneurysm.  Genetic screening was sent to [employer] [facility] today.  I reviewed the chest x-ray, echocardiogram, again reviewed the previously performed MRI for cardiac and thoracic aorta, advised her that we need to establish a baseline and CT angiography was ordered today.  Aortic root is mildly dilated by echocardiogram.  By MRI she had quadricuspid aortic valve but by 2D echocardiogram appears to be tricuspid.  However this should not change any management.  With regard to hyperlipidemia and also aortic aneurysm, I have recommended Lipitor 10 mg daily with goal LDL <100 in view of known  atherosclerotic vascular disease and also recommended losartan 50 mg in the evening, she will start with 25 mg and if she is tolerating this will increase to 50 mg in the evening as her blood pressure is very soft and normal.  We will obtain a BMP in 2 to 3 weeks and also lipid profile in 3 to 6 months and I would like to see him back in 6 months.    Yates Decamp, MD, [facility] 05/23/2021, 9:53 AM Office: [phone]

## 2021-05-24 DIAGNOSIS — M722 Plantar fascial fibromatosis: Secondary | ICD-10-CM

## 2021-06-05 ENCOUNTER — Telehealth: Payer: Self-pay | Admitting: Podiatry

## 2021-06-05 NOTE — Telephone Encounter (Signed)
Refurbished orthotics in... lvm for pt ok to pick up. 

## 2021-06-06 ENCOUNTER — Telehealth: Payer: Self-pay

## 2021-06-06 NOTE — Telephone Encounter (Signed)
Error

## 2021-06-12 DIAGNOSIS — L988 Other specified disorders of the skin and subcutaneous tissue: Secondary | ICD-10-CM | POA: Diagnosis not present

## 2021-06-12 DIAGNOSIS — D485 Neoplasm of uncertain behavior of skin: Secondary | ICD-10-CM | POA: Diagnosis not present

## 2021-06-13 ENCOUNTER — Other Ambulatory Visit: Payer: BC Managed Care – PPO

## 2021-06-19 DIAGNOSIS — L570 Actinic keratosis: Secondary | ICD-10-CM | POA: Diagnosis not present

## 2021-06-21 ENCOUNTER — Inpatient Hospital Stay: Admission: RE | Admit: 2021-06-21 | Payer: BC Managed Care – PPO | Source: Ambulatory Visit

## 2021-06-25 DIAGNOSIS — E7800 Pure hypercholesterolemia, unspecified: Secondary | ICD-10-CM | POA: Diagnosis not present

## 2021-06-25 DIAGNOSIS — I712 Thoracic aortic aneurysm, without rupture, unspecified: Secondary | ICD-10-CM | POA: Diagnosis not present

## 2021-06-26 ENCOUNTER — Other Ambulatory Visit: Payer: Self-pay | Admitting: Cardiology

## 2021-06-26 DIAGNOSIS — E7800 Pure hypercholesterolemia, unspecified: Secondary | ICD-10-CM

## 2021-06-26 LAB — BASIC METABOLIC PANEL
BUN/Creatinine Ratio: 14 (ref 12–28)
BUN: 10 mg/dL (ref 8–27)
CO2: 25 mmol/L (ref 20–29)
Calcium: 9.2 mg/dL (ref 8.7–10.3)
Chloride: 103 mmol/L (ref 96–106)
Creatinine, Ser: 0.74 mg/dL (ref 0.57–1.00)
Glucose: 89 mg/dL (ref 70–99)
Potassium: 4.2 mmol/L (ref 3.5–5.2)
Sodium: 141 mmol/L (ref 134–144)
eGFR: 93 mL/min/{1.73_m2} (ref >59)

## 2021-06-26 LAB — LIPID PANEL WITH LDL/HDL RATIO
Cholesterol, Total: 166 mg/dL (ref 100–199)
HDL: 59 mg/dL (ref >39)
LDL Chol Calc (NIH): 97 mg/dL (ref 0–99)
LDL/HDL Ratio: 1.6 ratio (ref 0.0–3.2)
Triglycerides: 51 mg/dL (ref 0–149)
VLDL Cholesterol Cal: 10 mg/dL (ref 5–40)

## 2021-06-26 NOTE — Progress Notes (Signed)
LDL is now <100 and it is at goal for you. Continue lipitor 10 mg daily

## 2021-07-08 ENCOUNTER — Ambulatory Visit
Admission: RE | Admit: 2021-07-08 | Discharge: 2021-07-08 | Disposition: A | Discharge status: Home / Self Care | Payer: BC Managed Care – PPO | Source: Ambulatory Visit | Attending: Cardiology | Admitting: Cardiology

## 2021-07-08 DIAGNOSIS — I712 Thoracic aortic aneurysm, without rupture, unspecified: Secondary | ICD-10-CM

## 2021-07-08 DIAGNOSIS — R2991 Unspecified symptoms and signs involving the musculoskeletal system: Secondary | ICD-10-CM

## 2021-07-08 DIAGNOSIS — R918 Other nonspecific abnormal finding of lung field: Secondary | ICD-10-CM | POA: Diagnosis not present

## 2021-07-08 MED ORDER — IOPAMIDOL (ISOVUE-300) INJECTION 61%
75.0000 mL | Freq: Once | INTRAVENOUS | Status: AC | PRN
  Administered 2021-07-08: 75 mL via INTRAVENOUS

## 2021-07-08 NOTE — Progress Notes (Signed)
CT angio of the chest with and without contrast 07/08/2021: 1. Mild uncomplicated fusiform aneurysmal dilatation of the ascending thoracic aorta measuring 42 mm in diameter. Recommend annual imaging followup by CTA or MRA. This recommendation follows 2010 ACCF/AHA/AATS/ACR/ASA/SCA/SCAI/SIR/STS/SVM Guidelines for the Diagnosis and Management of Patients with Thoracic Aortic Disease. Circulation. 2010; 121: P233-A076. Aortic aneurysm NOS (ICD10-I71.9).  The size of the ascending aorta has not changed since MRA 02/19/2017. 2. Post right lower lobectomy. 3. Indeterminate left-sided pulmonary nodules, the largest of which within the left upper lobe measures 0.6 cm in diameter. Comparison with prior outside examinations (if available), is advised. Otherwise, a non-contrast chest CT at 3-6 months is recommended. If the nodules are stable at time of repeat CT, then future CT at 18-24 months (from today's scan) is considered optional for low-risk patients, but is recommended for high-risk patients. This recommendation follows the consensus statement: Guidelines for Management of Incidental Pulmonary Nodules Detected on CT Images: From the Fleischner Society 2017; Radiology 2017; 284:228-243. 4. Indeterminate enhancing liver lesions, with dominant lesion within the dome of the right lobe of the liver measuring 1.6 cm in diameter, while potentially representative of flash filling hemangiomas, the lesions are incompletely characterized on present examination. Again, comparison with prior outside examinations is advised. Otherwise, further evaluation with abdominal MRI could be performed as indicated.

## 2021-07-11 DIAGNOSIS — H93293 Other abnormal auditory perceptions, bilateral: Secondary | ICD-10-CM | POA: Diagnosis not present

## 2021-09-24 ENCOUNTER — Telehealth: Payer: Self-pay

## 2021-09-24 NOTE — Telephone Encounter (Signed)
Will you please check your file for her information?

## 2021-09-26 NOTE — Progress Notes (Signed)
Please notify patient there were not clinically significant genetic findings on testing.

## 2021-10-03 DIAGNOSIS — H11001 Unspecified pterygium of right eye: Secondary | ICD-10-CM | POA: Diagnosis not present

## 2021-10-03 DIAGNOSIS — H5213 Myopia, bilateral: Secondary | ICD-10-CM | POA: Diagnosis not present

## 2021-11-20 ENCOUNTER — Encounter: Payer: Self-pay | Admitting: Cardiology

## 2021-11-20 ENCOUNTER — Ambulatory Visit: Payer: BC Managed Care – PPO | Admitting: Cardiology

## 2021-11-20 ENCOUNTER — Other Ambulatory Visit: Payer: Self-pay

## 2021-11-20 VITALS — BP 120/75 | HR 68 | Temp 98.9°F | Resp 16 | Ht 72.0 in | Wt 172.0 lb

## 2021-11-20 DIAGNOSIS — J479 Bronchiectasis, uncomplicated: Secondary | ICD-10-CM

## 2021-11-20 DIAGNOSIS — I7121 Aneurysm of the ascending aorta, without rupture: Secondary | ICD-10-CM

## 2021-11-20 DIAGNOSIS — R2991 Unspecified symptoms and signs involving the musculoskeletal system: Secondary | ICD-10-CM | POA: Diagnosis not present

## 2021-11-20 DIAGNOSIS — E7800 Pure hypercholesterolemia, unspecified: Secondary | ICD-10-CM

## 2021-11-20 NOTE — Progress Notes (Signed)
? ?Primary Physician/Referring:  Lawerance Cruel, MD ? ?Patient ID: Joann Smith, female    DOB: Mar 20, 1961, 61 y.o.   MRN: [id number] ? ?Chief Complaint  ?Patient presents with  ? Aortic aeurysm  ? Genetic testing  ? Follow-up  ?  6 months  ? ?HPI:   ? ?Joann Smith  is a 61 y.o. Caucasian female patient, her husband Joann Smith is also a patient of mine, patient has history of ascending aortic aneurysm, reactive airway disease and history of bronchiectasis SP right lower lobectomy at [facility] in 2017. ? ?She leads a fairly healthy lifestyle.  She is essentially asymptomatic.  ? ?Past Medical History:  ?Diagnosis Date  ? Chondromalacia, patella 07/09/2016  ? Moderate  ? Enlarged aorta (HCC)   ? Fatigue 07/09/2016  ? Fibromyalgia 07/09/2016  ? Hypertension   ? IBS (irritable bowel syndrome) 07/09/2016  ? IgA deficiency ([place]) 07/09/2016  ? Insomnia 07/09/2016  ? Liver nodule 07/09/2016  ? Osteoarthritis of both feet 07/09/2016  ? Osteoarthritis of both hands 07/09/2016  ? Mild  ? Osteoarthritis of both knees 07/09/2016  ? Moderate  ? Osteoporosis   ? Pedal edema 07/09/2016  ? Pulmonary mass 07/09/2016  ? F/u with pulmonologist yearly   ? Vitamin D deficiency 07/09/2016  ? ?Social History  ? ?Tobacco Use  ? Smoking status: Former  ?  Packs/day: 0.25  ?  Years: 5.00  ?  Pack years: 1.25  ?  Types: Cigarettes  ?  Quit date: 53  ?  Years since quitting: 43.2  ? Smokeless tobacco: Never  ? Tobacco comments:  ?  AGE 18's - only smoked socially  ?Substance Use Topics  ? Alcohol use: Yes  ?  Comment: 1-2 monthly  ? ?Marital Status: Married  ?ROS  ?Review of Systems  ?Cardiovascular:  Negative for chest pain, dyspnea on exertion and leg swelling.  ?Gastrointestinal:  Negative for melena.  ?Objective  ?Blood pressure 120/75, pulse 68, temperature 98.9 ?F (37.2 ?C), temperature source Temporal, resp. rate 16, height 6' (1.829 m), weight 172 lb (78 kg), last menstrual period 07/10/2010, SpO2 97 %. Body mass index is 23.33  kg/m?.  ?Vitals with BMI 11/20/2021 05/23/2021 02/18/2021  ?Height 6\' 0"  6\' 0"  6\' 0"   ?Weight 172 lbs 169 lbs 170 lbs 10 oz  ?BMI 23.32 22.92 23.13  ?Systolic [id number]  ?Diastolic 75 69 73  ?Pulse 68 70 83  ?  ? Physical Exam ?Constitutional:   ?   Comments: Marfanoid features noted with wide arm span, long fingers, high arched palate, pectus carinatum (mild)  ?Neck:  ?   Vascular: No carotid bruit or JVD.  ?Cardiovascular:  ?   Rate and Rhythm: Normal rate and regular rhythm.  ?   Pulses: Normal pulses and intact distal pulses.  ?   Heart sounds: Murmur heard.  ?Early systolic murmur is present with a grade of 2/6 at the upper right sternal border.  ?  No gallop.  ?Pulmonary:  ?   Effort: Pulmonary effort is normal.  ?   Breath sounds: Normal breath sounds.  ?Abdominal:  ?   General: Bowel sounds are normal.  ?   Palpations: Abdomen is soft.  ?Musculoskeletal:  ?   Right lower leg: No edema.  ?   Left lower leg: No edema.  ?  ? ?Laboratory examination:  ? ?BMP Latest Ref Rng & Units 06/25/2021  ?Glucose 70 - 99 mg/dL 89  ?BUN 8 - 27 mg/dL  10  ?Creatinine 0.57 - 1.00 mg/dL 7.86  ?BUN/Creat Ratio 12 - 28 14  ?Sodium 134 - 144 mmol/L 141  ?Potassium 3.5 - 5.2 mmol/L 4.2  ?Chloride 96 - 106 mmol/L 103  ?CO2 20 - 29 mmol/L 25  ?Calcium 8.7 - 10.3 mg/dL 9.2  ?  ?Lab Results  ?Component Value Date  ? CHOL 166 06/25/2021  ? HDL 59 06/25/2021  ? LDLCALC 97 06/25/2021  ? TRIG 51 06/25/2021  ?  ?External labs:  ? ?Labs 02/05/2021: ? ?Hb 13.7/HCT 41.5, platelets 210.  Normal indicis. ? ?Serum glucose 90 mg, BUN 12, creatinine 0.74, potassium 4.4, CMP normal. ? ?Vitamin D45.0. ? ?Total cholesterol 199, triglycerides 54, HDL 57, LDL 132.  Non-HDL cholesterol 142. ? ?TSH 01/25/2020: Normal. ? ?Medications and allergies  ? ?Allergies  ?Allergen Reactions  ? Pregabalin Other (See Comments)  ?  jittery  ? Augmentin [Amoxicillin-Pot Clavulanate]   ? Cymbalta [Duloxetine Hcl]   ? Flexeril [Cyclobenzaprine]   ?  headaches  ? Levaquin  [Levofloxacin]   ?  Anxiety, jittery   ? Sulfa Antibiotics   ? Zofran [Ondansetron Hcl]   ?  Headache, nausea   ? Omeprazole   ?  ?FINAL MEDICATION AS OF TODAY:  ? ? ?Current Outpatient Medications:  ?  albuterol (PROVENTIL) (2.5 MG/3ML) 0.083% nebulizer solution, Inhale into the lungs as needed. , Disp: , Rfl:  ?  albuterol (VENTOLIN HFA) 108 (90 Base) MCG/ACT inhaler, Inhale into the lungs every 6 (six) hours as needed for wheezing or shortness of breath., Disp: , Rfl:  ?  b complex vitamins tablet, Take 1 tablet by mouth daily., Disp: , Rfl:  ?  cholecalciferol (VITAMIN D) 1000 units tablet, Take 4,000 Units by mouth daily., Disp: , Rfl:  ?  diclofenac (VOLTAREN) 75 MG EC tablet, Take 1 tablet (75 mg total) by mouth 2 (two) times daily. (Patient taking differently: Take 75 mg by mouth as needed.), Disp: 50 tablet, Rfl: 2 ?  diclofenac sodium (VOLTAREN) 1 % GEL, Apply 3 grams to 3 large joints up to 3 times daily., Disp: 3 Tube, Rfl: 3 ?  escitalopram (LEXAPRO) 10 MG tablet, Take 10 mg daily by mouth., Disp: , Rfl: 0 ?  estradiol (ESTRACE) 0.1 MG/GM vaginal cream, Place 1 Applicatorful vaginally at bedtime., Disp: , Rfl:  ?  FIBER PO, Take by mouth., Disp: , Rfl:  ?  fluticasone (FLONASE) 50 MCG/ACT nasal spray, Place into the nose as needed. , Disp: , Rfl:  ?  ibuprofen (ADVIL,MOTRIN) 200 MG tablet, Take by mouth as needed. , Disp: , Rfl:  ?  Lactase 9000 units CHEW, Chew by mouth as needed. , Disp: , Rfl:  ?  valACYclovir (VALTREX) 500 MG tablet, Take 500 mg by mouth as needed. , Disp: , Rfl:   ? ?Radiology:  ? ?Cardiac MR 02/19/2017: ?1. The left ventricle is normal in cavity size and wall thickness. Global systolic function is normal. The  ?calculated LV ejection fraction is 64%. There are no regional wall motion abnormalities.  ?2. The right ventricle is normal in cavity size, wall thickness, and systolic function.  ?3. Both atria are normal in size.  ?4. The aortic valve appears to have an extra true raphe,  or possibly a false raphe between right and  ?non-coronary cusps. Hence, the aortic valve is quadricuspid or is trileaflet with an false raphe. There  ?is normal opening motion and normal forward velocities without any significant stenosis. There is trivial  ?aortic  regurgitation. There is also mild mitral regurgitation.  ?5. Delayed enhancement imaging demonstrates no evidence of myocardial infarction, scar or infiltrative disease.  ?6.  There is no evidence of an intracardiac thrombus.  ? ? ?Chest MRA 02/19/2017 ? ?1. The aortic root is mildly increased in caliber with a maximum dimension of 4.2 x 4.0 cm. The tubular  ?ascending aorta is also mildly dilated with a maximum dimension of 4.0 x 3.9 cm. The aortic arch, and  ?thoracic descending aorta are normal in diameter. There is no dissection seen.  ?2. The aortic arch is left sided. There is normal branching of the arch vessels.  ?3. The main and proximal branch pulmonary arteries are normal in size.  ?4. Normal systemic venous connections.  ? ?CXR PA/LAT 05/10/2021: ?Underlying COPD. Bronchiectasis S/P RUL lobectomy. Underlying emphysema.  ? ?CT angio of the chest with and without contrast 07/08/2021: ?1. Mild uncomplicated fusiform aneurysmal dilatation of the ?ascending thoracic aorta measuring 42 mm in diameter. Recommend annual imaging followup by CTA or MRA. This recommendation follows 2010 ACCF/AHA/AATS/ACR/ASA/SCA/SCAI/SIR/STS/SVM Guidelines for the Diagnosis and Management of Patients with Thoracic Aortic Disease. Circulation. 2010; 121ML:4928372. Aortic aneurysm NOS (ICD10-I71.9).  The size of the ascending aorta has not changed since MRA 02/19/2017. ?2. Post right lower lobectomy. ?3. Indeterminate left-sided pulmonary nodules, the largest of which within the left upper lobe measures 0.6 cm in diameter. Comparison with prior outside examinations (if available), is advised. Otherwise, a non-contrast chest CT at 3-6 months is recommended. If the nodules  are stable at time of repeat CT, then future CT at 18-24 months (from today's scan) is considered optional for low-risk patients, but is recommended for high-risk patients. This recommendation follows the

## 2021-11-28 DIAGNOSIS — M79641 Pain in right hand: Secondary | ICD-10-CM | POA: Diagnosis not present

## 2021-11-28 DIAGNOSIS — M25561 Pain in right knee: Secondary | ICD-10-CM | POA: Diagnosis not present

## 2021-11-28 DIAGNOSIS — M25551 Pain in right hip: Secondary | ICD-10-CM | POA: Diagnosis not present

## 2021-11-28 DIAGNOSIS — M549 Dorsalgia, unspecified: Secondary | ICD-10-CM | POA: Diagnosis not present

## 2021-11-28 DIAGNOSIS — M79672 Pain in left foot: Secondary | ICD-10-CM | POA: Diagnosis not present

## 2021-11-28 DIAGNOSIS — M79642 Pain in left hand: Secondary | ICD-10-CM | POA: Diagnosis not present

## 2021-11-28 DIAGNOSIS — M791 Myalgia, unspecified site: Secondary | ICD-10-CM | POA: Diagnosis not present

## 2021-11-28 DIAGNOSIS — M79643 Pain in unspecified hand: Secondary | ICD-10-CM | POA: Diagnosis not present

## 2021-11-28 DIAGNOSIS — M1990 Unspecified osteoarthritis, unspecified site: Secondary | ICD-10-CM | POA: Diagnosis not present

## 2021-11-28 DIAGNOSIS — M2550 Pain in unspecified joint: Secondary | ICD-10-CM | POA: Diagnosis not present

## 2021-11-28 DIAGNOSIS — M25562 Pain in left knee: Secondary | ICD-10-CM | POA: Diagnosis not present

## 2021-11-28 DIAGNOSIS — M79671 Pain in right foot: Secondary | ICD-10-CM | POA: Diagnosis not present

## 2021-12-12 DIAGNOSIS — M2550 Pain in unspecified joint: Secondary | ICD-10-CM | POA: Diagnosis not present

## 2021-12-12 DIAGNOSIS — M79643 Pain in unspecified hand: Secondary | ICD-10-CM | POA: Diagnosis not present

## 2021-12-12 DIAGNOSIS — M549 Dorsalgia, unspecified: Secondary | ICD-10-CM | POA: Diagnosis not present

## 2021-12-12 DIAGNOSIS — M1990 Unspecified osteoarthritis, unspecified site: Secondary | ICD-10-CM | POA: Diagnosis not present

## 2021-12-25 ENCOUNTER — Encounter: Payer: Self-pay | Admitting: Cardiology

## 2021-12-25 DIAGNOSIS — F3341 Major depressive disorder, recurrent, in partial remission: Secondary | ICD-10-CM | POA: Diagnosis not present

## 2021-12-25 DIAGNOSIS — F4322 Adjustment disorder with anxiety: Secondary | ICD-10-CM | POA: Diagnosis not present

## 2022-02-17 DIAGNOSIS — M797 Fibromyalgia: Secondary | ICD-10-CM | POA: Diagnosis not present

## 2022-02-17 DIAGNOSIS — M549 Dorsalgia, unspecified: Secondary | ICD-10-CM | POA: Diagnosis not present

## 2022-02-17 DIAGNOSIS — M1990 Unspecified osteoarthritis, unspecified site: Secondary | ICD-10-CM | POA: Diagnosis not present

## 2022-02-17 DIAGNOSIS — M79643 Pain in unspecified hand: Secondary | ICD-10-CM | POA: Diagnosis not present

## 2022-03-03 ENCOUNTER — Other Ambulatory Visit: Payer: Self-pay | Admitting: Obstetrics

## 2022-03-03 DIAGNOSIS — Z1231 Encounter for screening mammogram for malignant neoplasm of breast: Secondary | ICD-10-CM

## 2022-03-06 DIAGNOSIS — Z13220 Encounter for screening for lipoid disorders: Secondary | ICD-10-CM | POA: Diagnosis not present

## 2022-03-06 DIAGNOSIS — Z0000 Encounter for general adult medical examination without abnormal findings: Secondary | ICD-10-CM | POA: Diagnosis not present

## 2022-03-06 DIAGNOSIS — E559 Vitamin D deficiency, unspecified: Secondary | ICD-10-CM | POA: Diagnosis not present

## 2022-03-10 DIAGNOSIS — R9389 Abnormal findings on diagnostic imaging of other specified body structures: Secondary | ICD-10-CM | POA: Diagnosis not present

## 2022-03-10 DIAGNOSIS — E559 Vitamin D deficiency, unspecified: Secondary | ICD-10-CM | POA: Diagnosis not present

## 2022-03-10 DIAGNOSIS — Z0000 Encounter for general adult medical examination without abnormal findings: Secondary | ICD-10-CM | POA: Diagnosis not present

## 2022-03-10 DIAGNOSIS — F331 Major depressive disorder, recurrent, moderate: Secondary | ICD-10-CM | POA: Diagnosis not present

## 2022-03-10 DIAGNOSIS — J45909 Unspecified asthma, uncomplicated: Secondary | ICD-10-CM | POA: Diagnosis not present

## 2022-03-12 ENCOUNTER — Other Ambulatory Visit: Payer: Self-pay | Admitting: Family Medicine

## 2022-03-12 DIAGNOSIS — R9389 Abnormal findings on diagnostic imaging of other specified body structures: Secondary | ICD-10-CM

## 2022-03-14 ENCOUNTER — Other Ambulatory Visit: Payer: Self-pay | Admitting: Family Medicine

## 2022-03-14 DIAGNOSIS — E2839 Other primary ovarian failure: Secondary | ICD-10-CM

## 2022-03-20 DIAGNOSIS — R197 Diarrhea, unspecified: Secondary | ICD-10-CM | POA: Diagnosis not present

## 2022-03-20 DIAGNOSIS — R1013 Epigastric pain: Secondary | ICD-10-CM | POA: Diagnosis not present

## 2022-04-09 ENCOUNTER — Ambulatory Visit
Admission: RE | Admit: 2022-04-09 | Discharge: 2022-04-09 | Disposition: A | Discharge status: Home / Self Care | Payer: BC Managed Care – PPO | Source: Ambulatory Visit | Attending: Family Medicine | Admitting: Family Medicine

## 2022-04-09 ENCOUNTER — Other Ambulatory Visit: Payer: BC Managed Care – PPO

## 2022-04-09 DIAGNOSIS — R918 Other nonspecific abnormal finding of lung field: Secondary | ICD-10-CM | POA: Diagnosis not present

## 2022-04-09 DIAGNOSIS — R9389 Abnormal findings on diagnostic imaging of other specified body structures: Secondary | ICD-10-CM

## 2022-04-09 DIAGNOSIS — R911 Solitary pulmonary nodule: Secondary | ICD-10-CM | POA: Diagnosis not present

## 2022-04-16 DIAGNOSIS — F4322 Adjustment disorder with anxiety: Secondary | ICD-10-CM | POA: Diagnosis not present

## 2022-04-16 DIAGNOSIS — F3341 Major depressive disorder, recurrent, in partial remission: Secondary | ICD-10-CM | POA: Diagnosis not present

## 2022-04-21 DIAGNOSIS — Z6822 Body mass index (BMI) 22.0-22.9, adult: Secondary | ICD-10-CM | POA: Diagnosis not present

## 2022-04-21 DIAGNOSIS — Z01419 Encounter for gynecological examination (general) (routine) without abnormal findings: Secondary | ICD-10-CM | POA: Diagnosis not present

## 2022-04-21 DIAGNOSIS — Z87898 Personal history of other specified conditions: Secondary | ICD-10-CM | POA: Diagnosis not present

## 2022-05-05 DIAGNOSIS — Z79899 Other long term (current) drug therapy: Secondary | ICD-10-CM | POA: Diagnosis not present

## 2022-05-26 ENCOUNTER — Other Ambulatory Visit: Payer: Self-pay | Admitting: Obstetrics

## 2022-05-26 ENCOUNTER — Ambulatory Visit
Admission: RE | Admit: 2022-05-26 | Discharge: 2022-05-26 | Disposition: A | Discharge status: Home / Self Care | Payer: BC Managed Care – PPO | Source: Ambulatory Visit | Attending: Obstetrics | Admitting: Obstetrics

## 2022-05-26 DIAGNOSIS — Z1231 Encounter for screening mammogram for malignant neoplasm of breast: Secondary | ICD-10-CM | POA: Diagnosis not present

## 2022-06-12 DIAGNOSIS — K5730 Diverticulosis of large intestine without perforation or abscess without bleeding: Secondary | ICD-10-CM | POA: Diagnosis not present

## 2022-06-12 DIAGNOSIS — D122 Benign neoplasm of ascending colon: Secondary | ICD-10-CM | POA: Diagnosis not present

## 2022-06-12 DIAGNOSIS — D123 Benign neoplasm of transverse colon: Secondary | ICD-10-CM | POA: Diagnosis not present

## 2022-06-12 DIAGNOSIS — R197 Diarrhea, unspecified: Secondary | ICD-10-CM | POA: Diagnosis not present

## 2022-06-19 DIAGNOSIS — M549 Dorsalgia, unspecified: Secondary | ICD-10-CM | POA: Diagnosis not present

## 2022-06-19 DIAGNOSIS — M1990 Unspecified osteoarthritis, unspecified site: Secondary | ICD-10-CM | POA: Diagnosis not present

## 2022-06-19 DIAGNOSIS — M797 Fibromyalgia: Secondary | ICD-10-CM | POA: Diagnosis not present

## 2022-06-19 DIAGNOSIS — M79643 Pain in unspecified hand: Secondary | ICD-10-CM | POA: Diagnosis not present

## 2022-06-26 DIAGNOSIS — Z23 Encounter for immunization: Secondary | ICD-10-CM | POA: Diagnosis not present

## 2022-07-24 DIAGNOSIS — F3341 Major depressive disorder, recurrent, in partial remission: Secondary | ICD-10-CM | POA: Diagnosis not present

## 2022-07-24 DIAGNOSIS — F4322 Adjustment disorder with anxiety: Secondary | ICD-10-CM | POA: Diagnosis not present

## 2022-08-14 DIAGNOSIS — F3341 Major depressive disorder, recurrent, in partial remission: Secondary | ICD-10-CM | POA: Diagnosis not present

## 2022-08-14 DIAGNOSIS — F4322 Adjustment disorder with anxiety: Secondary | ICD-10-CM | POA: Diagnosis not present

## 2022-08-26 ENCOUNTER — Ambulatory Visit
Admission: RE | Admit: 2022-08-26 | Discharge: 2022-08-26 | Disposition: A | Discharge status: Home / Self Care | Payer: BC Managed Care – PPO | Source: Ambulatory Visit | Attending: Family Medicine | Admitting: Family Medicine

## 2022-08-26 DIAGNOSIS — E2839 Other primary ovarian failure: Secondary | ICD-10-CM

## 2022-08-26 DIAGNOSIS — M8589 Other specified disorders of bone density and structure, multiple sites: Secondary | ICD-10-CM | POA: Diagnosis not present

## 2022-08-26 DIAGNOSIS — Z780 Asymptomatic menopausal state: Secondary | ICD-10-CM | POA: Diagnosis not present

## 2022-09-14 DIAGNOSIS — U071 COVID-19: Secondary | ICD-10-CM | POA: Diagnosis not present

## 2022-09-25 DIAGNOSIS — L720 Epidermal cyst: Secondary | ICD-10-CM | POA: Diagnosis not present

## 2022-09-25 DIAGNOSIS — R52 Pain, unspecified: Secondary | ICD-10-CM | POA: Diagnosis not present

## 2022-09-25 DIAGNOSIS — L918 Other hypertrophic disorders of the skin: Secondary | ICD-10-CM | POA: Diagnosis not present

## 2022-10-08 DIAGNOSIS — F3341 Major depressive disorder, recurrent, in partial remission: Secondary | ICD-10-CM | POA: Diagnosis not present

## 2022-10-08 DIAGNOSIS — F4322 Adjustment disorder with anxiety: Secondary | ICD-10-CM | POA: Diagnosis not present

## 2022-10-27 DIAGNOSIS — M79643 Pain in unspecified hand: Secondary | ICD-10-CM | POA: Diagnosis not present

## 2022-10-27 DIAGNOSIS — Z79899 Other long term (current) drug therapy: Secondary | ICD-10-CM | POA: Diagnosis not present

## 2022-10-27 DIAGNOSIS — M797 Fibromyalgia: Secondary | ICD-10-CM | POA: Diagnosis not present

## 2022-10-27 DIAGNOSIS — M0609 Rheumatoid arthritis without rheumatoid factor, multiple sites: Secondary | ICD-10-CM | POA: Diagnosis not present

## 2022-11-17 ENCOUNTER — Other Ambulatory Visit: Payer: BC Managed Care – PPO

## 2022-11-21 ENCOUNTER — Ambulatory Visit: Payer: BC Managed Care – PPO

## 2022-11-21 DIAGNOSIS — I7121 Aneurysm of the ascending aorta, without rupture: Secondary | ICD-10-CM | POA: Diagnosis not present

## 2022-11-21 DIAGNOSIS — M722 Plantar fascial fibromatosis: Secondary | ICD-10-CM

## 2022-11-26 ENCOUNTER — Encounter: Payer: Self-pay | Admitting: Cardiology

## 2022-11-26 ENCOUNTER — Ambulatory Visit: Payer: BC Managed Care – PPO | Admitting: Cardiology

## 2022-11-26 VITALS — BP 106/70 | HR 73 | Resp 16 | Ht 72.0 in | Wt 164.4 lb

## 2022-11-26 DIAGNOSIS — R2991 Unspecified symptoms and signs involving the musculoskeletal system: Secondary | ICD-10-CM | POA: Diagnosis not present

## 2022-11-26 DIAGNOSIS — I7121 Aneurysm of the ascending aorta, without rupture: Secondary | ICD-10-CM

## 2022-11-26 NOTE — Progress Notes (Addendum)
Primary Physician/Referring:  Lawerance Cruel, MD  Patient ID: Lisa Roca, female    DOB: 10-Sep-1960, 62 y.o.   MRN: [id number]  Chief Complaint  Patient presents with   aortic root dilation   Follow-up    1 year   HPI:    HONORA GWILT  is a 62 y.o. Caucasian female patient, her husband Mr. Shantez Wiker is also a patient of mine, patient has history of ascending aortic aneurysm, reactive airway disease and history of bronchiectasis SP right lower lobectomy at [facility] in 2017, diagnosis of seronegative rheumatoid arthritis in 2023 presents here for annual visit.  She leads a fairly healthy lifestyle.  She is essentially asymptomatic.   Past Medical History:  Diagnosis Date   Chondromalacia, patella 07/09/2016   Moderate   Enlarged aorta (HCC)    Fatigue 07/09/2016   Fibromyalgia 07/09/2016   Hypertension    IBS (irritable bowel syndrome) 07/09/2016   IgA deficiency ([place]) 07/09/2016   Insomnia 07/09/2016   Liver nodule 07/09/2016   Osteoarthritis of both feet 07/09/2016   Osteoarthritis of both hands 07/09/2016   Mild   Osteoarthritis of both knees 07/09/2016   Moderate   Osteoporosis    Pedal edema 07/09/2016   Pulmonary mass 07/09/2016   F/u with pulmonologist yearly    Vitamin D deficiency 07/09/2016   Social History   Tobacco Use   Smoking status: Former    Packs/day: 0.25    Years: 5.00    Additional pack years: 0.00    Total pack years: 1.25    Types: Cigarettes    Quit date: 1980    Years since quitting: 44.2   Smokeless tobacco: Never   Tobacco comments:    AGE 56's - only smoked socially  Substance Use Topics   Alcohol use: Yes    Comment: 1-2 monthly   Marital Status: Married  ROS  Review of Systems  Cardiovascular:  Negative for chest pain, dyspnea on exertion and leg swelling.  Gastrointestinal:  Negative for melena.   Objective  Blood pressure 106/70, pulse 73, resp. rate 16, height 6' (1.829 m), weight 164 lb 6.4 oz (74.6 kg), last  menstrual period 07/10/2010, SpO2 97 %. Body mass index is 22.3 kg/m.     11/26/2022    1:31 PM 11/20/2021   12:58 PM 05/23/2021    8:47 AM  Vitals with BMI  Height 6\' 0"  6\' 0"  6\' 0"   Weight 164 lbs 6 oz 172 lbs 169 lbs  BMI 22.29 [id number]  Systolic [id number]  Diastolic 70 75 69  Pulse 73 68 70     Physical Exam Constitutional:      Comments: Marfanoid features noted with wide arm span, long fingers, high arched palate, pectus carinatum (mild)  Neck:     Vascular: No carotid bruit or JVD.  Cardiovascular:     Rate and Rhythm: Normal rate and regular rhythm.     Pulses: Normal pulses and intact distal pulses.     Heart sounds: Murmur heard.     Early systolic murmur is present with a grade of 2/6 at the upper right sternal border.     No gallop.  Pulmonary:     Effort: Pulmonary effort is normal.     Breath sounds: Normal breath sounds.  Abdominal:     General: Bowel sounds are normal.     Palpations: Abdomen is soft.  Musculoskeletal:     Right lower leg: No edema.  Left lower leg: No edema.     Laboratory examination:      Latest Ref Rng & Units 06/25/2021    8:10 AM  BMP  Glucose 70 - 99 mg/dL 89   BUN 8 - 27 mg/dL 10   Creatinine 0.57 - 1.00 mg/dL 0.74   BUN/Creat Ratio 12 - 28 14   Sodium 134 - 144 mmol/L 141   Potassium 3.5 - 5.2 mmol/L 4.2   Chloride 96 - 106 mmol/L 103   CO2 20 - 29 mmol/L 25   Calcium 8.7 - 10.3 mg/dL 9.2     Lab Results  Component Value Date   CHOL 166 06/25/2021   HDL 59 06/25/2021   LDLCALC 97 06/25/2021   TRIG 51 06/25/2021    External labs:   Labs 03/07/2022:  Vitamin D 52.0.  Total cholesterol 197, triglycerides 66, HDL 62, LDL 123.  Labs 02/05/2021:  Hb 13.7/HCT 41.5, platelets 210.  Normal indicis.  Serum glucose 90 mg, BUN 12, creatinine 0.74, potassium 4.4, CMP normal.  Vitamin D45.0.  Total cholesterol 199, triglycerides 54, HDL 57, LDL 132.  Non-HDL cholesterol 142.  TSH 01/25/2020:  Normal.  Medications and allergies   Allergies  Allergen Reactions   Pregabalin Other (See Comments)    jittery   Augmentin [Amoxicillin-Pot Clavulanate]    Cymbalta [Duloxetine Hcl]    Flexeril [Cyclobenzaprine]     headaches   Levaquin [Levofloxacin]     Anxiety, jittery    Sulfa Antibiotics    Zofran [Ondansetron Hcl]     Headache, nausea    Omeprazole     FINAL MEDICATION AS OF TODAY:    Current Outpatient Medications:    albuterol (PROVENTIL) (2.5 MG/3ML) 0.083% nebulizer solution, Inhale into the lungs as needed. , Disp: , Rfl:    albuterol (VENTOLIN HFA) 108 (90 Base) MCG/ACT inhaler, Inhale into the lungs every 6 (six) hours as needed for wheezing or shortness of breath., Disp: , Rfl:    b complex vitamins tablet, Take 1 tablet by mouth daily., Disp: , Rfl:    cholecalciferol (VITAMIN D) 1000 units tablet, Take 4,000 Units by mouth daily., Disp: , Rfl:    diclofenac (VOLTAREN) 75 MG EC tablet, Take 1 tablet (75 mg total) by mouth 2 (two) times daily. (Patient taking differently: Take 75 mg by mouth as needed.), Disp: 50 tablet, Rfl: 2   diclofenac sodium (VOLTAREN) 1 % GEL, Apply 3 grams to 3 large joints up to 3 times daily., Disp: 3 Tube, Rfl: 3   escitalopram (LEXAPRO) 10 MG tablet, Take 10 mg daily by mouth., Disp: , Rfl: 0   estradiol (ESTRACE) 0.1 MG/GM vaginal cream, Place 1 Applicatorful vaginally at bedtime., Disp: , Rfl:    FIBER PO, Take by mouth., Disp: , Rfl:    fluticasone (FLONASE) 50 MCG/ACT nasal spray, Place into the nose as needed. , Disp: , Rfl:    ibuprofen (ADVIL,MOTRIN) 200 MG tablet, Take by mouth as needed. , Disp: , Rfl:    Lactase 9000 units CHEW, Chew by mouth as needed. , Disp: , Rfl:    methotrexate (RHEUMATREX) 2.5 MG tablet, Take 10 mg by mouth once a week., Disp: , Rfl:    hydroxychloroquine (PLAQUENIL) 200 MG tablet, Take 200 mg by mouth 2 (two) times daily., Disp: , Rfl:    Radiology:   Cardiac MR 02/19/2017: 1. The left ventricle is  normal in cavity size and wall thickness. Global systolic function is normal. The  calculated LV ejection  fraction is 64%. There are no regional wall motion abnormalities.  2. The right ventricle is normal in cavity size, wall thickness, and systolic function.  3. Both atria are normal in size.  4. The aortic valve appears to have an extra true raphe, or possibly a false raphe between right and  non-coronary cusps. Hence, the aortic valve is quadricuspid or is trileaflet with an false raphe. There  is normal opening motion and normal forward velocities without any significant stenosis. There is trivial  aortic regurgitation. There is also mild mitral regurgitation.  5. Delayed enhancement imaging demonstrates no evidence of myocardial infarction, scar or infiltrative disease.  6.  There is no evidence of an intracardiac thrombus.    Chest MRA 02/19/2017  1. The aortic root is mildly increased in caliber with a maximum dimension of 4.2 x 4.0 cm. The tubular  ascending aorta is also mildly dilated with a maximum dimension of 4.0 x 3.9 cm. The aortic arch, and  thoracic descending aorta are normal in diameter. There is no dissection seen.  2. The aortic arch is left sided. There is normal branching of the arch vessels.  3. The main and proximal branch pulmonary arteries are normal in size.  4. Normal systemic venous connections.   CXR PA/LAT 05/10/2021: Underlying COPD. Bronchiectasis S/P RUL lobectomy. Underlying emphysema.   CT angio of the chest with and without contrast 07/08/2021: 1. Mild uncomplicated fusiform aneurysmal dilatation of the ascending thoracic aorta measuring 42 mm in diameter. Recommend annual imaging followup by CTA or MRA. This recommendation follows 2010 ACCF/AHA/AATS/ACR/ASA/SCA/SCAI/SIR/STS/SVM Guidelines for the Diagnosis and Management of Patients with Thoracic Aortic Disease. Circulation. 2010; [id number]. Aortic aneurysm NOS (ICD10-I71.9).  The size of the  ascending aorta has not changed since MRA 02/19/2017. 2. Post right lower lobectomy. 3. Indeterminate left-sided pulmonary nodules, the largest of which within the left upper lobe measures 0.6 cm in diameter. Comparison with prior outside examinations (if available), is advised. Otherwise, a non-contrast chest CT at 3-6 months is recommended. If the nodules are stable at time of repeat CT, then future CT at 18-24 months (from today's scan) is considered optional for low-risk patients, but is recommended for high-risk patients. This recommendation follows the consensus statement: Guidelines for Management of Incidental Pulmonary Nodules Detected on CT Images: From the Fleischner Society 2017; Radiology 2017; 284:228-243. 4. Indeterminate enhancing liver lesions, with dominant lesion within the dome of the right lobe of the liver measuring 1.6 cm in diameter, while potentially representative of flash filling hemangiomas, the lesions are incompletely characterized on present examination. Again, comparison with prior outside examinations is advised. Otherwise, further evaluation with abdominal MRI could be performed as indicated.  Cardiac Studies:   [facility] genetics TAADNext: Analysis of 35 genes associated with thoracic aortic aneurysms and dissections 09/20/2020:  Variant of unknown significance detected.  No known clinically actionable alterations were detected.  Stress echocardiogram 02/01/2016: Patient exercised for 11 minutes and 2 seconds.  Achieved target heart rate and 13.4 METS.  Normal blood pressure response. Stress echocardiogram was normal without wall motion abnormality. Dilated great vessels, ascending aorta = 4.1 cm; main pulmonary artery normal = 2.2 cm.  PCV ECHOCARDIOGRAM COMPLETE 11/21/2022  Narrative Echocardiogram 11/21/2022: Left ventricle cavity is normal in size and wall thickness. Normal global wall motion. Normal LV systolic function with EF 57%. Normal diastolic filling  pattern. Structurally normal trileaflet aortic valve.  Trace aortic regurgitation. No evidence of pulmonary hypertension. The aortic root is dilated, 4.0 cm at sinus  of Valsalva. Ascending aortic aneurysm 4.1cm. Previous study on 02/26/2021 reported aortic root and prox ascending aorta at 3.8 cm.     EKG:   EKG 11/26/2022: Normal sinus rhythm with rate of 60 bpm, normal axis, incomplete right bundle branch block.  Normal EKG.  Compared to 11/20/2021, no significant change.  Assessment     ICD-10-CM   1. Aortic root aneurysm (HCC)  I71.21 EKG 12-Lead    2. Aneurysm of ascending aorta without rupture (HCC)  I71.21     3. 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   Orders Placed This Encounter  Procedures   EKG 12-Lead    Recommendations:   Larkin InaJudith A Keathley is a 62 y.o. Caucasian female patient, her husband Mr. Alva Garneteter Tensley is also a patient of mine, patient has history of ascending aortic aneurysm, reactive airway disease and history of bronchiectasis SP [facility], diagnosis of seronegative rheumatoid arthritis in 2023 presents here for annual visit.  1. Aortic root aneurysm (HCC) I reviewed the results of the echocardiogram, the question is whether there is a good correlation between echocardiographic findings of aortic aneurysm versus radiographic imaging.  Previously ascending aorta was measured at 3.8 cm by echocardiogram and 4.2 by MRI.  I would like to confirm if the present value of 4.2 is consistent with MRI findings by repeating MRI.  If indeed no change in the size, we can continue echocardiographic annual follow-up.  2. Aneurysm of ascending aorta without rupture (HCC) As dictated above as dictated above, she is getting MRI of the chest for  follow-up of aortic aneurysm.  She is not on an ARB or beta-blocker as her blood pressure is very soft.  She also has mild elevation in LDL but again does not want to be on a statin.  3. Marfanoid habitus She has had negative screening test by genetics.  Mutant variant noted only.  Unless I see multiple abnormality on the MRI, I will see her back on an annual basis.  I ordered BMP today as an add-on (12/18/2022   Yates DecampJay Ishanvi Mcquitty, MD, [facility] CityFACC 11/26/2022, 1:52 PM Office: [phone]

## 2022-12-10 DIAGNOSIS — M722 Plantar fascial fibromatosis: Secondary | ICD-10-CM

## 2022-12-11 DIAGNOSIS — M1990 Unspecified osteoarthritis, unspecified site: Secondary | ICD-10-CM | POA: Diagnosis not present

## 2022-12-11 DIAGNOSIS — M0609 Rheumatoid arthritis without rheumatoid factor, multiple sites: Secondary | ICD-10-CM | POA: Diagnosis not present

## 2022-12-11 DIAGNOSIS — Z79899 Other long term (current) drug therapy: Secondary | ICD-10-CM | POA: Diagnosis not present

## 2022-12-11 DIAGNOSIS — M797 Fibromyalgia: Secondary | ICD-10-CM | POA: Diagnosis not present

## 2022-12-18 DIAGNOSIS — I7121 Aneurysm of the ascending aorta, without rupture: Secondary | ICD-10-CM | POA: Diagnosis not present

## 2022-12-18 NOTE — Addendum Note (Signed)
Addended by: Delrae Rend on: 12/18/2022 01:40 PM   Modules accepted: Orders

## 2022-12-19 LAB — BASIC METABOLIC PANEL
BUN/Creatinine Ratio: 18 (ref 12–28)
BUN: 13 mg/dL (ref 8–27)
CO2: 23 mmol/L (ref 20–29)
Calcium: 9.3 mg/dL (ref 8.7–10.3)
Chloride: 100 mmol/L (ref 96–106)
Creatinine, Ser: 0.71 mg/dL (ref 0.57–1.00)
Glucose: 114 mg/dL — ABNORMAL HIGH (ref 70–99)
Potassium: 4 mmol/L (ref 3.5–5.2)
Sodium: 137 mmol/L (ref 134–144)
eGFR: 97 mL/min/{1.73_m2} (ref >59)

## 2022-12-24 ENCOUNTER — Other Ambulatory Visit: Payer: BC Managed Care – PPO

## 2022-12-24 DIAGNOSIS — M62830 Muscle spasm of back: Secondary | ICD-10-CM | POA: Diagnosis not present

## 2023-01-09 ENCOUNTER — Encounter: Payer: Self-pay | Admitting: Cardiology

## 2023-01-14 ENCOUNTER — Ambulatory Visit
Admission: RE | Admit: 2023-01-14 | Discharge: 2023-01-14 | Disposition: A | Discharge status: Home / Self Care | Payer: BC Managed Care – PPO | Source: Ambulatory Visit | Attending: Cardiology | Admitting: Cardiology

## 2023-01-14 DIAGNOSIS — I7121 Aneurysm of the ascending aorta, without rupture: Secondary | ICD-10-CM

## 2023-01-14 DIAGNOSIS — R2991 Unspecified symptoms and signs involving the musculoskeletal system: Secondary | ICD-10-CM

## 2023-01-14 MED ORDER — GADOPICLENOL 0.5 MMOL/ML IV SOLN
8.0000 mL | Freq: Once | INTRAVENOUS | Status: AC | PRN
  Administered 2023-01-14: 8 mL via INTRAVENOUS

## 2023-01-15 DIAGNOSIS — M7061 Trochanteric bursitis, right hip: Secondary | ICD-10-CM | POA: Diagnosis not present

## 2023-01-15 DIAGNOSIS — M25551 Pain in right hip: Secondary | ICD-10-CM | POA: Diagnosis not present

## 2023-01-15 DIAGNOSIS — M62830 Muscle spasm of back: Secondary | ICD-10-CM | POA: Diagnosis not present

## 2023-01-15 NOTE — Progress Notes (Signed)
MRI chest with and without contrast 01/15/2023: Comparison made 07/08/2021. Stable mild fusiform aneurysmal dilation of the ascending thoracic aorta with a maximal diameter of 4.1 cm. Recommend annual imaging followup by CTA or MRA.   Otherwise, no focal signal abnormality or abnormal enhancement within the visualized portions of the lungs, mediastinum, upper abdomen or musculoskeletal soft tissues.  Bilateral retro pectoral breast augmentation prostheses. T2 hyperintense cysts within the medial aspect of the left breast tissue. This has been previously evaluated with ultrasound in March of 2018.

## 2023-02-04 DIAGNOSIS — M25551 Pain in right hip: Secondary | ICD-10-CM | POA: Diagnosis not present

## 2023-02-04 DIAGNOSIS — M7061 Trochanteric bursitis, right hip: Secondary | ICD-10-CM | POA: Diagnosis not present

## 2023-02-04 DIAGNOSIS — M62830 Muscle spasm of back: Secondary | ICD-10-CM | POA: Diagnosis not present

## 2023-02-05 DIAGNOSIS — J069 Acute upper respiratory infection, unspecified: Secondary | ICD-10-CM | POA: Diagnosis not present

## 2023-02-11 DIAGNOSIS — H31001 Unspecified chorioretinal scars, right eye: Secondary | ICD-10-CM | POA: Diagnosis not present

## 2023-02-11 DIAGNOSIS — Z79899 Other long term (current) drug therapy: Secondary | ICD-10-CM | POA: Diagnosis not present

## 2023-02-11 DIAGNOSIS — H5203 Hypermetropia, bilateral: Secondary | ICD-10-CM | POA: Diagnosis not present

## 2023-02-16 DIAGNOSIS — Z03818 Encounter for observation for suspected exposure to other biological agents ruled out: Secondary | ICD-10-CM | POA: Diagnosis not present

## 2023-02-16 DIAGNOSIS — R52 Pain, unspecified: Secondary | ICD-10-CM | POA: Diagnosis not present

## 2023-02-16 DIAGNOSIS — R5381 Other malaise: Secondary | ICD-10-CM | POA: Diagnosis not present

## 2023-02-16 DIAGNOSIS — R051 Acute cough: Secondary | ICD-10-CM | POA: Diagnosis not present

## 2023-03-10 DIAGNOSIS — M25551 Pain in right hip: Secondary | ICD-10-CM | POA: Diagnosis not present

## 2023-03-10 DIAGNOSIS — M62830 Muscle spasm of back: Secondary | ICD-10-CM | POA: Diagnosis not present

## 2023-03-10 DIAGNOSIS — M7061 Trochanteric bursitis, right hip: Secondary | ICD-10-CM | POA: Diagnosis not present

## 2023-03-19 DIAGNOSIS — Z13220 Encounter for screening for lipoid disorders: Secondary | ICD-10-CM | POA: Diagnosis not present

## 2023-03-19 DIAGNOSIS — E559 Vitamin D deficiency, unspecified: Secondary | ICD-10-CM | POA: Diagnosis not present

## 2023-03-23 DIAGNOSIS — M797 Fibromyalgia: Secondary | ICD-10-CM | POA: Diagnosis not present

## 2023-03-23 DIAGNOSIS — Z79899 Other long term (current) drug therapy: Secondary | ICD-10-CM | POA: Diagnosis not present

## 2023-03-23 DIAGNOSIS — M0609 Rheumatoid arthritis without rheumatoid factor, multiple sites: Secondary | ICD-10-CM | POA: Diagnosis not present

## 2023-03-23 DIAGNOSIS — M1990 Unspecified osteoarthritis, unspecified site: Secondary | ICD-10-CM | POA: Diagnosis not present

## 2023-03-24 DIAGNOSIS — J309 Allergic rhinitis, unspecified: Secondary | ICD-10-CM | POA: Diagnosis not present

## 2023-03-24 DIAGNOSIS — Z0000 Encounter for general adult medical examination without abnormal findings: Secondary | ICD-10-CM | POA: Diagnosis not present

## 2023-03-24 DIAGNOSIS — J479 Bronchiectasis, uncomplicated: Secondary | ICD-10-CM | POA: Diagnosis not present

## 2023-03-24 DIAGNOSIS — F331 Major depressive disorder, recurrent, moderate: Secondary | ICD-10-CM | POA: Diagnosis not present

## 2023-03-25 DIAGNOSIS — M25551 Pain in right hip: Secondary | ICD-10-CM | POA: Diagnosis not present

## 2023-03-25 DIAGNOSIS — M7061 Trochanteric bursitis, right hip: Secondary | ICD-10-CM | POA: Diagnosis not present

## 2023-03-25 DIAGNOSIS — M62830 Muscle spasm of back: Secondary | ICD-10-CM | POA: Diagnosis not present

## 2023-03-26 ENCOUNTER — Other Ambulatory Visit: Payer: Self-pay | Admitting: Obstetrics

## 2023-03-26 DIAGNOSIS — Z1231 Encounter for screening mammogram for malignant neoplasm of breast: Secondary | ICD-10-CM

## 2023-04-03 DIAGNOSIS — M62830 Muscle spasm of back: Secondary | ICD-10-CM | POA: Diagnosis not present

## 2023-04-03 DIAGNOSIS — M25551 Pain in right hip: Secondary | ICD-10-CM | POA: Diagnosis not present

## 2023-04-03 DIAGNOSIS — M7061 Trochanteric bursitis, right hip: Secondary | ICD-10-CM | POA: Diagnosis not present

## 2023-04-20 DIAGNOSIS — F4322 Adjustment disorder with anxiety: Secondary | ICD-10-CM | POA: Diagnosis not present

## 2023-04-20 DIAGNOSIS — F3341 Major depressive disorder, recurrent, in partial remission: Secondary | ICD-10-CM | POA: Diagnosis not present

## 2023-04-28 ENCOUNTER — Other Ambulatory Visit: Payer: Self-pay | Admitting: Family Medicine

## 2023-04-28 DIAGNOSIS — R9389 Abnormal findings on diagnostic imaging of other specified body structures: Secondary | ICD-10-CM

## 2023-04-29 DIAGNOSIS — H903 Sensorineural hearing loss, bilateral: Secondary | ICD-10-CM | POA: Diagnosis not present

## 2023-05-13 DIAGNOSIS — F3341 Major depressive disorder, recurrent, in partial remission: Secondary | ICD-10-CM | POA: Diagnosis not present

## 2023-05-14 ENCOUNTER — Ambulatory Visit
Admission: RE | Admit: 2023-05-14 | Discharge: 2023-05-14 | Disposition: A | Discharge status: Home / Self Care | Payer: BC Managed Care – PPO | Source: Ambulatory Visit | Attending: Family Medicine | Admitting: Family Medicine

## 2023-05-14 DIAGNOSIS — R918 Other nonspecific abnormal finding of lung field: Secondary | ICD-10-CM | POA: Diagnosis not present

## 2023-05-14 DIAGNOSIS — R9389 Abnormal findings on diagnostic imaging of other specified body structures: Secondary | ICD-10-CM

## 2023-05-14 DIAGNOSIS — I7121 Aneurysm of the ascending aorta, without rupture: Secondary | ICD-10-CM | POA: Diagnosis not present

## 2023-05-27 ENCOUNTER — Encounter: Payer: Self-pay | Admitting: Cardiology

## 2023-06-02 ENCOUNTER — Ambulatory Visit
Admission: RE | Admit: 2023-06-02 | Discharge: 2023-06-02 | Disposition: A | Discharge status: Home / Self Care | Payer: BC Managed Care – PPO | Source: Ambulatory Visit | Attending: Obstetrics

## 2023-06-02 DIAGNOSIS — Z1231 Encounter for screening mammogram for malignant neoplasm of breast: Secondary | ICD-10-CM

## 2023-06-03 DIAGNOSIS — F3341 Major depressive disorder, recurrent, in partial remission: Secondary | ICD-10-CM | POA: Diagnosis not present

## 2023-06-08 DIAGNOSIS — Z87898 Personal history of other specified conditions: Secondary | ICD-10-CM | POA: Diagnosis not present

## 2023-06-08 DIAGNOSIS — Z01419 Encounter for gynecological examination (general) (routine) without abnormal findings: Secondary | ICD-10-CM | POA: Diagnosis not present

## 2023-06-08 DIAGNOSIS — Z6821 Body mass index (BMI) 21.0-21.9, adult: Secondary | ICD-10-CM | POA: Diagnosis not present

## 2023-06-11 ENCOUNTER — Encounter: Payer: Self-pay | Admitting: Pulmonary Disease

## 2023-06-11 ENCOUNTER — Ambulatory Visit: Payer: BC Managed Care – PPO | Admitting: Pulmonary Disease

## 2023-06-11 VITALS — BP 97/64 | HR 71 | Ht 72.0 in | Wt 161.8 lb

## 2023-06-11 DIAGNOSIS — R918 Other nonspecific abnormal finding of lung field: Secondary | ICD-10-CM

## 2023-06-11 NOTE — Patient Instructions (Signed)
No changes to medication  Return to clinic as needed

## 2023-06-11 NOTE — Progress Notes (Signed)
@Patient  ID: Joann Smith, female    DOB: Feb 06, 1961, 62 y.o.   MRN: [id number]  Chief Complaint  Patient presents with  . Consult    Pt had a ct 9/5 reports of lung nodules     Referring provider: Daisy Floro, MD  HPI:   62 y.o. woman with history of isolated bronchiectasis of the right lower lobe complicated by repetitive exacerbations and hemoptysis status post right lower lobe resection whom we are seeing for evaluation of pulmonary nodules.  Multiple cardiology notes reviewed.  Multiple PCP notes reviewed.  Multiple prior pulmonary notes via [facility] system reviewed.  Overall she doing well.  No real symptoms.  No real concerns today.  Demonstrated 2 left-sided nodules 3 mm and 6 mm in 06/2021 during surveillance for thoracic aorta aneurysm.  Subsequent CT scan 8/23 demonstrated stability on my review interpretation.  Subsequent CT scan 05/2023 demonstrates stability on my review and interpretation.  Per Fleischner criteria given largest 6 mm, multiple nodules, after 24 months additional surveillance is no longer recommended.  Per radiology they consider these benign and also recommend no further follow-up.  We discussed in detail.  She understands this.  She will continue 1 year CT scans of her aorta for surveillance of aneurysm, if there is any change we could certainly reevaluate these nodules.  There is a silver lining in terms of peace of mind.  She has occasional cough.  Sound spasmodic.  Lozenges help.  Sometimes smells trigger.  Also occasional dyspnea on exertion.  Albuterol helps.  We discussed possibility of asthma.  She was told this may be a possibility in the past, got lost in the shuffle with her issues with bronchiectasis lung resection etc. in the past.  Questionaires / Pulmonary Flowsheets:   ACT:      No data to display          MMRC:     No data to display          Epworth:      No data to display          Tests:   FENO:  No results found  for: "NITRICOXIDE"  PFT:     No data to display          WALK:      No data to display          Imaging: Personally reviewed and as per EMR and discussion in this note MM 3D SCREEN BREAST W/IMPLANT BILATERAL  Result Date: 06/04/2023 CLINICAL DATA:  Screening. EXAM: DIGITAL SCREENING BILATERAL MAMMOGRAM WITH IMPLANTS, CAD AND TOMOSYNTHESIS TECHNIQUE: Bilateral screening digital craniocaudal and mediolateral oblique mammograms were obtained. Bilateral screening digital breast tomosynthesis was performed. The images were evaluated with computer-aided detection. Standard and/or implant displaced views were performed. COMPARISON:  Previous exam(s). ACR Breast Density Category b: There are scattered areas of fibroglandular density. FINDINGS: The patient has retropectoral implants. There are no findings suspicious for malignancy. IMPRESSION: No mammographic evidence of malignancy. A result letter of this screening mammogram will be mailed directly to the patient. RECOMMENDATION: Screening mammogram in one year. (Code:[id number]-B-01Y) BI-RADS CATEGORY  1: Negative. Electronically Signed   By: Hulan Saas M.D.   On: 06/04/2023 08:43   CT CHEST WO CONTRAST  Result Date: 05/26/2023 CLINICAL DATA:  History of right lower lobectomy. Lung nodule follow-up. EXAM: CT CHEST WITHOUT CONTRAST TECHNIQUE: Multidetector CT imaging of the chest was performed following the standard protocol without IV contrast. RADIATION DOSE REDUCTION: This  exam was performed according to the departmental dose-optimization program which includes automated exposure control, adjustment of the mA and/or kV according to patient size and/or use of iterative reconstruction technique. COMPARISON:  04/09/2022 and 07/08/2021. FINDINGS: Cardiovascular: Ascending thoracic aorta measures 4.5 cm, 0.4 cm on the most recent prior study, 4.2 cm on 07/08/2021. No aortic atherosclerotic calcifications. Heart normal in size configuration. No coronary  artery calcifications and no pericardial effusion. Mediastinum/Nodes: No neck base, mediastinal or hilar masses. No enlarged lymph nodes. Trachea and esophagus are unremarkable. Lungs/Pleura: Stable changes from the previous right lower lobe resection. 6 mm nodule, posteromedial left upper lobe, image 64, series 8. 3 mm nodule, posterior base of the left lower lobe, image 129, series 8. Both nodules are stable since the 07/08/2021 exam, benign. No new nodules. No lung consolidation or evidence of edema. No pleural effusion or pneumothorax. Upper Abdomen: Unremarkable. Musculoskeletal: No fracture or acute finding. No bone lesion. Stable benign left breast mass. IMPRESSION: 1. No acute findings. 2. 2 left-sided lung nodules, left upper lobe and left lower lobe, both stable since October 2022 consistent with benign etiologies. No additional follow-up recommended. 3. No new lung nodules. 4. Dilated ascending thoracic aorta to 4.5 cm, which has increased from prior exams. Ascending thoracic aortic aneurysm. Recommend semi-annual imaging followup by CTA or MRA and referral to cardiothoracic surgery if not already obtained. This recommendation follows 2010 ACCF/AHA/AATS/ACR/ASA/SCA/SCAI/SIR/STS/SVM Guidelines for the Diagnosis and Management of Patients With Thoracic Aortic Disease. Circulation. 2010; 121: X914-N829. Aortic aneurysm NOS (ICD10-I71.9) Aortic aneurysm NOS (ICD10-I71.9). Electronically Signed   By: Amie Portland M.D.   On: 05/26/2023 12:36    Lab Results: Personally reviewed CBC No results found for: "WBC", "RBC", "HGB", "HCT", "PLT", "MCV", "MCH", "MCHC", "RDW", "LYMPHSABS", "MONOABS", "EOSABS", "BASOSABS"  BMET    Component Value Date/Time   NA 137 12/18/2022 1342   K 4.0 12/18/2022 1342   CL 100 12/18/2022 1342   CO2 23 12/18/2022 1342   GLUCOSE 114 (H) 12/18/2022 1342   BUN 13 12/18/2022 1342   CREATININE 0.71 12/18/2022 1342   CALCIUM 9.3 12/18/2022 1342    BNP No results found for:  "BNP"  ProBNP No results found for: "PROBNP"  Specialty Problems       Pulmonary Problems   Hemoptysis    Overview:  IMO R2.2 load      Pneumonia   Allergic rhinitis    Overview:  IMO Load 2016 R1.3      Dyspnea    Overview:  10/1 IMO update      Cough   Wheezing   Bronchiectasis (HCC)    Overview:  Added automatically from request for surgery [id number]      Pulmonary mass    F/u with pulmonologist yearly        Allergies  Allergen Reactions  . Pregabalin Other (See Comments)    jittery  . Augmentin [Amoxicillin-Pot Clavulanate]   . Cymbalta [Duloxetine Hcl]   . Flexeril [Cyclobenzaprine]     headaches  . Levaquin [Levofloxacin]     Anxiety, jittery   . Sulfa Antibiotics   . Zofran [Ondansetron Hcl]     Headache, nausea   . Omeprazole     Immunization History  Administered Date(s) Administered  . DT (Pediatric) 01/12/2003  . Influenza Inj Mdck Quad Pf 06/12/2017  . Influenza Split 07/12/2014  . Influenza, Seasonal, Injecte, Preservative Fre 06/28/2015  . Influenza,inj,Quad PF,6+ Mos 07/02/2018  . Influenza,inj,quad, With Preservative 06/12/2017  . Influenza-Unspecified 07/04/2015  . PFIZER(Purple  Top)SARS-COV-2 Vaccination 11/12/2019, 12/03/2019  . PPD Test 07/16/2008    Past Medical History:  Diagnosis Date  . Chondromalacia, patella 07/09/2016   Moderate  . Enlarged aorta (HCC)   . Fatigue 07/09/2016  . Fibromyalgia 07/09/2016  . Hypertension   . IBS (irritable bowel syndrome) 07/09/2016  . IgA deficiency (HCC) 07/09/2016  . Insomnia 07/09/2016  . Liver nodule 07/09/2016  . Osteoarthritis of both feet 07/09/2016  . Osteoarthritis of both hands 07/09/2016   Mild  . Osteoarthritis of both knees 07/09/2016   Moderate  . Osteoporosis   . Pedal edema 07/09/2016  . Pulmonary mass 07/09/2016   F/u with pulmonologist yearly   . Vitamin D deficiency 07/09/2016    Tobacco History: Social History   Tobacco Use  Smoking Status Former   . Current packs/day: 0.00  . Average packs/day: 0.3 packs/day for 5.0 years (1.3 ttl pk-yrs)  . Types: Cigarettes  . Start date: 67  . Quit date: 14  . Years since quitting: 44.7  Smokeless Tobacco Never  Tobacco Comments   AGE 67's - only smoked socially   Counseling given: Not Answered Tobacco comments: AGE 67's - only smoked socially   Continue to not smoke  Outpatient Encounter Medications as of 06/11/2023  Medication Sig  . albuterol (PROVENTIL) (2.5 MG/3ML) 0.083% nebulizer solution Inhale into the lungs as needed.   Marland Kitchen albuterol (VENTOLIN HFA) 108 (90 Base) MCG/ACT inhaler Inhale into the lungs every 6 (six) hours as needed for wheezing or shortness of breath.  Marland Kitchen b complex vitamins tablet Take 1 tablet by mouth daily.  . cholecalciferol (VITAMIN D) 1000 units tablet Take 4,000 Units by mouth daily.  . diclofenac (VOLTAREN) 75 MG EC tablet Take 1 tablet (75 mg total) by mouth 2 (two) times daily. (Patient taking differently: Take 75 mg by mouth as needed.)  . diclofenac sodium (VOLTAREN) 1 % GEL Apply 3 grams to 3 large joints up to 3 times daily.  Marland Kitchen escitalopram (LEXAPRO) 10 MG tablet Take 10 mg daily by mouth.  . estradiol (ESTRACE) 0.1 MG/GM vaginal cream Place 1 Applicatorful vaginally at bedtime.  Marland Kitchen FIBER PO Take by mouth.  . fluticasone (FLONASE) 50 MCG/ACT nasal spray Place into the nose as needed.   . hydroxychloroquine (PLAQUENIL) 200 MG tablet Take 200 mg by mouth 2 (two) times daily.  Marland Kitchen ibuprofen (ADVIL,MOTRIN) 200 MG tablet Take by mouth as needed.   . Lactase 9000 units CHEW Chew by mouth as needed.   . methotrexate (RHEUMATREX) 2.5 MG tablet Take 10 mg by mouth once a week.   No facility-administered encounter medications on file as of 06/11/2023.     Review of Systems  Review of Systems  No chest pain exertion.  No orthopnea or PND.  Comprehensive review of systems otherwise negative. Physical Exam  BP 97/64   Pulse 71   Ht 6' (1.829 m)   Wt 161  lb 12.8 oz (73.4 kg)   LMP 07/10/2010   SpO2 96%   BMI 21.94 kg/m   Wt Readings from Last 5 Encounters:  06/11/23 161 lb 12.8 oz (73.4 kg)  11/26/22 164 lb 6.4 oz (74.6 kg)  11/20/21 172 lb (78 kg)  05/23/21 169 lb (76.7 kg)  02/18/21 170 lb 9.6 oz (77.4 kg)    BMI Readings from Last 5 Encounters:  06/11/23 21.94 kg/m  11/26/22 22.30 kg/m  11/20/21 23.33 kg/m  05/23/21 22.92 kg/m  02/18/21 23.14 kg/m     Physical Exam General: Sitting in  chair, no acute distress Eyes: EOMI, no icterus Neck: Supple, no JVP Pulmonary: Clear, good excursion, normal work of breathing Cardiovascular: Warm, no edema Abdomen: Nondistended by sounds present MSK: No synovitis, no joint effusion Neuro: Normal gait, no weakness Psych: Normal mood, full affect   Assessment & Plan:   Lung nodules: 2 well-rounded on the left.  Appearance is reassuring.  Largest is 6 mm.  Per Fleischner criteria, now have 24 months of stability.  No further imaging required.  She will continue yearly CT scans for her aortic aneurysm, if any change happy to intervene and reassess.  Suspect these could be hematomas given the well-rounded nature as well as history of hematoma on lung resection due to bronchiectasis in 2017.  Cough, dyspnea on exertion: Intermittent.  Possible asthma as dyspnea improves with albuterol.  Does not use very often.  Consider maintenance inhaler in the future if symptoms worsen.  Elevated right hemidiaphragm: Suspect volume loss after resection of right lower lobe.  Relatively asymptomatic.  Return if symptoms worsen or fail to improve, for f/u Dr. Judeth Horn.   Karren Burly, MD 06/11/2023   This appointment required 61 minutes of patient care (this includes precharting, chart review, review of results, face-to-face care, etc.).

## 2023-06-17 DIAGNOSIS — F3341 Major depressive disorder, recurrent, in partial remission: Secondary | ICD-10-CM | POA: Diagnosis not present

## 2023-06-30 DIAGNOSIS — F3341 Major depressive disorder, recurrent, in partial remission: Secondary | ICD-10-CM | POA: Diagnosis not present

## 2023-07-12 ENCOUNTER — Ambulatory Visit
Admission: EM | Admit: 2023-07-12 | Discharge: 2023-07-12 | Disposition: A | Discharge status: Home / Self Care | Payer: BC Managed Care – PPO | Attending: Internal Medicine | Admitting: Internal Medicine

## 2023-07-12 ENCOUNTER — Ambulatory Visit: Payer: BC Managed Care – PPO

## 2023-07-12 DIAGNOSIS — R509 Fever, unspecified: Secondary | ICD-10-CM | POA: Diagnosis not present

## 2023-07-12 DIAGNOSIS — R059 Cough, unspecified: Secondary | ICD-10-CM | POA: Diagnosis not present

## 2023-07-12 DIAGNOSIS — J209 Acute bronchitis, unspecified: Secondary | ICD-10-CM | POA: Diagnosis not present

## 2023-07-12 DIAGNOSIS — R918 Other nonspecific abnormal finding of lung field: Secondary | ICD-10-CM | POA: Diagnosis not present

## 2023-07-12 MED ORDER — PROMETHAZINE-DM 6.25-15 MG/5ML PO SYRP
5.0000 mL | ORAL_SOLUTION | Freq: Four times a day (QID) | ORAL | 0 refills | Status: AC | PRN
Start: 2023-07-12 — End: 2023-07-18

## 2023-07-12 NOTE — Discharge Instructions (Addendum)
Follow-up with your doctor in a few days Continue use of your inhaler

## 2023-07-12 NOTE — ED Triage Notes (Signed)
Pt presents to UC w/ c/o headahce, sore throat, sweats, chills, and a cough x4 days Pt reports she's had part of her lung removed. She states ibuprofen and her albuterol nebulizer do not help her symptoms.

## 2023-07-12 NOTE — ED Provider Notes (Signed)
[facility]    CSN: [id number] Arrival date & time: 07/12/23  [id number]      History   Chief Complaint No chief complaint on file.   HPI Joann Smith is a 62 y.o. female.   HPI Sick for 4 days has had headache, night sweats, nonproductive cough and wheezing.  Has a history of bronchiectasis has had part of her lung removed.  Uses her inhaler for wheezing with some relief.  Has not taken her temperature but has felt feverish.  Mitts sore throat secondary to cough.  Home COVID test 2 days ago was negative Denies rhinorrhea, nasal congestion, chest pain, palpitation, dizziness, abdominal pain, nausea, vomiting, diarrhea.  She is not a smoker.  Past Medical History:  Diagnosis Date   Chondromalacia, patella 07/09/2016   Moderate   Enlarged aorta (HCC)    Fatigue 07/09/2016   Fibromyalgia 07/09/2016   Hypertension    IBS (irritable bowel syndrome) 07/09/2016   IgA deficiency (HCC) 07/09/2016   Insomnia 07/09/2016   Liver nodule 07/09/2016   Osteoarthritis of both feet 07/09/2016   Osteoarthritis of both hands 07/09/2016   Mild   Osteoarthritis of both knees 07/09/2016   Moderate   Osteoporosis    Pedal edema 07/09/2016   Pulmonary mass 07/09/2016   F/u with pulmonologist yearly    Vitamin D deficiency 07/09/2016    Patient Active Problem List   Diagnosis Date Noted   Atrophic vaginitis 02/22/2018   Genital herpes simplex 02/22/2018   Low back pain 02/22/2018   Aortic root aneurysm 01/19/2018   Ascending aortic aneurysm (HCC) 01/19/2018   Fibromyalgia 07/09/2016   Fatigue 07/09/2016   Insomnia 07/09/2016   Osteoarthritis of both hands 07/09/2016   Osteoarthritis of both feet 07/09/2016   Osteoarthritis of both knees 07/09/2016   Chondromalacia, patella 07/09/2016   Vitamin D deficiency 07/09/2016   Pulmonary mass 07/09/2016   IBS (irritable bowel syndrome) 07/09/2016   IgA deficiency (HCC) 07/09/2016   Liver nodule 07/09/2016   Pedal edema 07/09/2016    Greater trochanteric bursitis of both hips 07/09/2016   Bronchiectasis (HCC) 02/11/2016   History of adenomatous polyp of colon 02/06/2016   Wheezing 09/13/2015   Cough 10/11/2014   Dyspnea 08/17/2014   Allergic rhinitis 11/23/2008   Hemoptysis 07/15/2008   Pneumonia 07/15/2008   Sebaceous cyst 03/04/2007   Abnormal involuntary movement 06/12/2005   Dermatophytosis, nail 05/20/2005   Calculus, kidney 11/18/2002   GERD (gastroesophageal reflux disease) 11/18/2002    Past Surgical History:  Procedure Laterality Date   ABDOMINAL HYSTERECTOMY     AUGMENTATION MAMMAPLASTY Bilateral    LUNG REMOVAL, PARTIAL Right 02/2016   SHOULDER ARTHROSCOPY Left 06/2016    OB History   No obstetric history on file.      Home Medications    Prior to Admission medications   Medication Sig Start Date End Date Taking? Authorizing Provider  albuterol (PROVENTIL) (2.5 MG/3ML) 0.083% nebulizer solution Inhale into the lungs as needed.  03/31/16   [provider]  albuterol (VENTOLIN HFA) 108 (90 Base) MCG/ACT inhaler Inhale into the lungs every 6 (six) hours as needed for wheezing or shortness of breath.    [provider]  b complex vitamins tablet Take 1 tablet by mouth daily.    [provider]  cholecalciferol (VITAMIN D) 1000 units tablet Take 4,000 Units by mouth daily.    [provider]  diclofenac (VOLTAREN) 75 MG EC tablet Take 1 tablet (75 mg total) by mouth 2 (  two) times daily. Patient taking differently: Take 75 mg by mouth as needed. 02/04/18   Lenn Sink, DPM  diclofenac sodium (VOLTAREN) 1 % GEL Apply 3 grams to 3 large joints up to 3 times daily. 07/27/18   Gearldine Bienenstock, PA-C  escitalopram (LEXAPRO) 10 MG tablet Take 10 mg daily by mouth. 07/12/17   [provider]  estradiol (ESTRACE) 0.1 MG/GM vaginal cream Place 1 Applicatorful vaginally at bedtime.    [provider]  FIBER PO Take by mouth.    [provider]   fluticasone (FLONASE) 50 MCG/ACT nasal spray Place into the nose as needed.  03/19/09   [provider]  hydroxychloroquine (PLAQUENIL) 200 MG tablet Take 200 mg by mouth 2 (two) times daily.    [provider]  ibuprofen (ADVIL,MOTRIN) 200 MG tablet Take by mouth as needed.     [provider]  Lactase 9000 units CHEW Chew by mouth as needed.     [provider]  methotrexate (RHEUMATREX) 2.5 MG tablet Take 10 mg by mouth once a week. 10/28/22   [provider]    Family History Family History  Problem Relation Age of Onset   Dementia Mother    Osteoarthritis Mother    Cancer Father    Arthritis Sister    Thyroid disease Sister    Arthritis Sister    Diabetes Sister        pre-diabetes    Depression Son     Social History Social History   Tobacco Use   Smoking status: Former    Current packs/day: 0.00    Average packs/day: 0.3 packs/day for 5.0 years (1.3 ttl pk-yrs)    Types: Cigarettes    Start date: 11    Quit date: 1980    Years since quitting: 44.8   Smokeless tobacco: Never   Tobacco comments:    AGE 27's - only smoked socially  Vaping Use   Vaping status: Never Used  Substance Use Topics   Alcohol use: Yes    Comment: 1-2 monthly   Drug use: Never     Allergies   Pregabalin, Augmentin [amoxicillin-pot clavulanate], Cymbalta [duloxetine hcl], Flexeril [cyclobenzaprine], Levaquin [levofloxacin], Sulfa antibiotics, Zofran [ondansetron hcl], and Omeprazole   Review of Systems Review of Systems  Constitutional:  Positive for chills, diaphoresis, fatigue and fever.  HENT:  Positive for sore throat. Negative for congestion, postnasal drip, rhinorrhea, sinus pain and trouble swallowing.   Respiratory:  Positive for cough, chest tightness and wheezing. Negative for shortness of breath.   Cardiovascular:  Negative for chest pain.  Gastrointestinal:  Negative for abdominal pain, nausea and vomiting.  Neurological:   Positive for headaches. Negative for dizziness.     Physical Exam Triage Vital Signs ED Triage Vitals  Encounter Vitals Group     BP 07/12/23 0849 99/65     Systolic BP Percentile --      Diastolic BP Percentile --      Pulse Rate 07/12/23 0849 89     Resp 07/12/23 0849 16     Temp 07/12/23 0849 98.6 F (37 C)     Temp Source 07/12/23 0849 Oral     SpO2 07/12/23 0849 95 %     Weight --      Height --      Head Circumference --      Peak Flow --      Pain Score 07/12/23 0847 0     Pain Loc --  Pain Education --      Exclude from Growth Chart --    No data found.  Updated Vital Signs BP 99/65 (BP Location: Right Arm)   Pulse 89   Temp 98.6 F (37 C) (Oral)   Resp 16   LMP 07/10/2010   SpO2 95%   Visual Acuity Right Eye Distance:   Left Eye Distance:   Bilateral Distance:    Right Eye Near:   Left Eye Near:    Bilateral Near:     Physical Exam   UC Treatments / Results  Labs (all labs ordered are listed, but only abnormal results are displayed) Labs Reviewed - No data to display  EKG   Radiology DG Chest 2 View  Result Date: 07/12/2023 CLINICAL DATA:  Cough, fever EXAM: CHEST - 2 VIEW COMPARISON:  05/10/2021 FINDINGS: Moderate interstitial opacities in a predominantly perihilar distribution, new since previous. Heart size and mediastinal contours are within normal limits. No effusion. Visualized bones unremarkable. IMPRESSION: Moderate perihilar interstitial opacities, new since previous. Electronically Signed   By: Corlis Leak M.D.   On: 07/12/2023 09:27    Procedures Procedures (including critical care time)  Medications Ordered in UC Medications - No data to display  Initial Impression / Assessment and Plan / UC Course  I have reviewed the triage vital signs and the nursing notes.  Pertinent labs & imaging results that were available during my care of the patient were reviewed by me and considered in my medical decision making (see chart for  details).     62 year old female with nonproductive cough for several days associated with headache , night sweats and wheezing. Chest x-ray was obtained no evidence of pneumonia, shows perihilar interstitial opacities which can be associated with bronchitis.  Recommend follow-up with her PCP.  Continued use of her inhaler Final Clinical Impressions(s) / UC Diagnoses   Final diagnoses:  None   Discharge Instructions   None    ED Prescriptions   None    PDMP not reviewed this encounter.   Meliton Rattan, [place] 07/12/23 [phone]

## 2023-07-20 DIAGNOSIS — F3341 Major depressive disorder, recurrent, in partial remission: Secondary | ICD-10-CM | POA: Diagnosis not present

## 2023-07-28 DIAGNOSIS — J219 Acute bronchiolitis, unspecified: Secondary | ICD-10-CM | POA: Diagnosis not present

## 2023-07-28 DIAGNOSIS — M0609 Rheumatoid arthritis without rheumatoid factor, multiple sites: Secondary | ICD-10-CM | POA: Diagnosis not present

## 2023-07-28 DIAGNOSIS — I7121 Aneurysm of the ascending aorta, without rupture: Secondary | ICD-10-CM | POA: Diagnosis not present

## 2023-07-29 DIAGNOSIS — F3341 Major depressive disorder, recurrent, in partial remission: Secondary | ICD-10-CM | POA: Diagnosis not present

## 2023-08-11 DIAGNOSIS — Z79899 Other long term (current) drug therapy: Secondary | ICD-10-CM | POA: Diagnosis not present

## 2023-08-11 DIAGNOSIS — M1990 Unspecified osteoarthritis, unspecified site: Secondary | ICD-10-CM | POA: Diagnosis not present

## 2023-08-11 DIAGNOSIS — M0609 Rheumatoid arthritis without rheumatoid factor, multiple sites: Secondary | ICD-10-CM | POA: Diagnosis not present

## 2023-08-11 DIAGNOSIS — M797 Fibromyalgia: Secondary | ICD-10-CM | POA: Diagnosis not present

## 2023-08-14 DIAGNOSIS — F3341 Major depressive disorder, recurrent, in partial remission: Secondary | ICD-10-CM | POA: Diagnosis not present

## 2023-08-19 DIAGNOSIS — F3341 Major depressive disorder, recurrent, in partial remission: Secondary | ICD-10-CM | POA: Diagnosis not present

## 2023-09-03 DIAGNOSIS — J0140 Acute pansinusitis, unspecified: Secondary | ICD-10-CM | POA: Diagnosis not present

## 2023-09-11 DIAGNOSIS — F3341 Major depressive disorder, recurrent, in partial remission: Secondary | ICD-10-CM | POA: Diagnosis not present

## 2023-09-29 DIAGNOSIS — F3341 Major depressive disorder, recurrent, in partial remission: Secondary | ICD-10-CM | POA: Diagnosis not present

## 2023-10-09 DIAGNOSIS — F3341 Major depressive disorder, recurrent, in partial remission: Secondary | ICD-10-CM | POA: Diagnosis not present

## 2023-10-27 DIAGNOSIS — Z79899 Other long term (current) drug therapy: Secondary | ICD-10-CM | POA: Diagnosis not present

## 2023-10-27 DIAGNOSIS — M797 Fibromyalgia: Secondary | ICD-10-CM | POA: Diagnosis not present

## 2023-10-27 DIAGNOSIS — M0609 Rheumatoid arthritis without rheumatoid factor, multiple sites: Secondary | ICD-10-CM | POA: Diagnosis not present

## 2023-10-27 DIAGNOSIS — M1990 Unspecified osteoarthritis, unspecified site: Secondary | ICD-10-CM | POA: Diagnosis not present

## 2023-11-25 ENCOUNTER — Ambulatory Visit: Payer: Self-pay | Admitting: Cardiology

## 2023-11-26 ENCOUNTER — Ambulatory Visit: Payer: BC Managed Care – PPO | Attending: Cardiology | Admitting: Cardiology

## 2023-11-26 ENCOUNTER — Encounter: Payer: Self-pay | Admitting: Cardiology

## 2023-11-26 VITALS — BP 112/72 | HR 70 | Resp 16 | Ht 72.0 in | Wt 160.0 lb

## 2023-11-26 DIAGNOSIS — E7800 Pure hypercholesterolemia, unspecified: Secondary | ICD-10-CM

## 2023-11-26 DIAGNOSIS — R2991 Unspecified symptoms and signs involving the musculoskeletal system: Secondary | ICD-10-CM | POA: Diagnosis not present

## 2023-11-26 DIAGNOSIS — I7121 Aneurysm of the ascending aorta, without rupture: Secondary | ICD-10-CM | POA: Diagnosis not present

## 2023-11-26 NOTE — Progress Notes (Signed)
 Cardiology Office Note:  .   Date:  11/28/2023  ID:  Larkin Ina, DOB 05-03-1961, MRN [id number] PCP: Daisy Floro, MD  [facility] Providers Cardiologist:  Yates Decamp, MD   History of Present Illness: .   Joann Smith is a 63 y.o.  Caucasian female patient, her husband Mr. Teyonna Plaisted is also a patient of mine, patient has history of ascending aortic aneurysm, reactive airway disease and history of bronchiectasis SP right lower lobectomy at [facility] in 2017, diagnosis of seronegative rheumatoid arthritis in 2023 presents here for annual visit.   She leads a fairly healthy lifestyle.  She is essentially asymptomatic.   Discussed the use of AI scribe software for clinical note transcription with the patient, who gave verbal consent to proceed.  History of Present Illness Joann Smith is a 63 year old female with an ascending aortic aneurysm who presents for follow-up evaluation.  The ascending aortic aneurysm measures 4.5 cm, stable since August 2023, with a slight increase from 4.2 cm to 4.4 cm noted two years ago. She remains asymptomatic regarding the aneurysm.  LDL cholesterol has increased from 123 mg/dL in 6578 to 469 mg/dL in 6295. She is not on cholesterol-lowering medication, opting for dietary management. CT scan shows no plaque in the aorta or coronary arteries.  She maintains an active lifestyle and has achieved weight loss through diet. Her husband is also losing weight.   Labs   Lab Results  Component Value Date   CHOL 166 06/25/2021   HDL 59 06/25/2021   LDLCALC 97 06/25/2021   TRIG 51 06/25/2021   Lab Results  Component Value Date   NA 137 12/18/2022   K 4.0 12/18/2022   CO2 23 12/18/2022   GLUCOSE 114 (H) 12/18/2022   BUN 13 12/18/2022   CREATININE 0.71 12/18/2022   CALCIUM 9.3 12/18/2022   EGFR 97 12/18/2022      Latest Ref Rng & Units 12/18/2022    1:42 PM 06/25/2021    8:10 AM  BMP  Glucose 70 - 99 mg/dL 284  89   BUN 8 - 27 mg/dL 13  10    Creatinine 1.32 - 1.00 mg/dL 4.40  1.02   BUN/Creat Ratio 12 - 28 18  14    Sodium 134 - 144 mmol/L 137  141   Potassium 3.5 - 5.2 mmol/L 4.0  4.2   Chloride 96 - 106 mmol/L 100  103   CO2 20 - 29 mmol/L 23  25   Calcium 8.7 - 10.3 mg/dL 9.3  9.2     External Labs:  K PN PCP labs 03/19/2023:  Total cholesterol 214, triglycerides 66, HDL 59, LDL 143.  [facility] genetics TAADNext: Analysis of 35 genes associated with thoracic aortic aneurysms and dissections 09/20/2020:  Variant of unknown significance detected.  No known clinically actionable alterations were detected.  Review of Systems  Cardiovascular:  Negative for chest pain, dyspnea on exertion and leg swelling.   Physical Exam:   VS:  BP 112/72 (BP Location: Left Arm, Patient Position: Sitting, Cuff Size: Normal)   Pulse 70   Resp 16   Ht 6' (1.829 m)   Wt 160 lb (72.6 kg)   LMP 07/10/2010   SpO2 97%   BMI 21.70 kg/m    Wt Readings from Last 3 Encounters:  11/26/23 160 lb (72.6 kg)  06/11/23 161 lb 12.8 oz (73.4 kg)  11/26/22 164 lb 6.4 oz (74.6 kg)    Physical Exam Constitutional:  Comments: Marfanoid habitus  Neck:     Vascular: No carotid bruit or JVD.  Cardiovascular:     Rate and Rhythm: Normal rate and regular rhythm.     Pulses: Intact distal pulses.     Heart sounds: Normal heart sounds. No murmur heard.    No gallop.  Pulmonary:     Effort: Pulmonary effort is normal.     Breath sounds: Normal breath sounds.  Abdominal:     General: Bowel sounds are normal.     Palpations: Abdomen is soft.  Musculoskeletal:     Right lower leg: No edema.     Left lower leg: No edema.    Studies Reviewed: .    Echocardiogram 11/21/2022: Left ventricle cavity is normal in size and wall thickness. Normal global wall motion. Normal LV systolic function with EF 57%. Normal diastolic filling pattern. Structurally normal trileaflet aortic valve.  Trace aortic regurgitation. No evidence of pulmonary  hypertension. The aortic root is dilated, 4.0 cm at sinus of Valsalva. Ascending aortic aneurysm 4.1cm. Previous study on 02/26/2021 reported aortic root and prox ascending aorta at 3.8 cm.   CT angiogram chest 05/14/2023 1. No acute findings. 2. 2 left-sided lung nodules, left upper lobe and left lower lobe, both stable since October 2022 consistent with benign etiologies. No additional follow-up recommended. 3. Dilated ascending thoracic aorta to 4.5 cm, which has increased from prior exams on 04/09/2022 and 07/08/2021, on 04/09/2022 measured 4.4 cm in largest diameter.  There is no aortic atherosclerosis or coronary calcification.  EKG:    EKG Interpretation Date/Time:  Thursday November 26 2023 11:06:19 EDT Ventricular Rate:  64 PR Interval:  194 QRS Duration:  86 QT Interval:  402 QTC Calculation: 414 R Axis:   11  Text Interpretation: EKG 11/26/2023: Normal sinus rhythm at rate of 64 bpm, normal axis, poor R wave progression, probably normal variant.  Compared to 11/26/2022, no significant change. Confirmed by Delrae Rend [phone]) on 11/26/2023 11:38:17 AM    EKG 11/26/2022: Normal sinus rhythm with rate of 60 bpm, normal axis, incomplete right bundle branch block. Normal EKG.   Medications and allergies    Allergies  Allergen Reactions   Pregabalin Other (See Comments)    jittery   Augmentin [Amoxicillin-Pot Clavulanate]    Cymbalta [Duloxetine Hcl]    Flexeril [Cyclobenzaprine]     headaches   Levaquin [Levofloxacin]     Anxiety, jittery    Sulfa Antibiotics    Zofran [Ondansetron Hcl]     Headache, nausea    Omeprazole     Current Outpatient Medications:    albuterol (PROVENTIL) (2.5 MG/3ML) 0.083% nebulizer solution, Inhale into the lungs as needed. , Disp: , Rfl:    albuterol (VENTOLIN HFA) 108 (90 Base) MCG/ACT inhaler, Inhale into the lungs every 6 (six) hours as needed for wheezing or shortness of breath., Disp: , Rfl:    Ascorbic Acid 500 MG CAPS, 1 capsule Orally  once a day, Disp: , Rfl:    b complex vitamins tablet, Take 1 tablet by mouth daily., Disp: , Rfl:    cholecalciferol (VITAMIN D) 1000 units tablet, Take 4,000 Units by mouth daily., Disp: , Rfl:    Cyanocobalamin 1000 MCG TBCR, 1 tablet Orally Once a day for 30 day(s), Disp: , Rfl:    diclofenac sodium (VOLTAREN) 1 % GEL, Apply 3 grams to 3 large joints up to 3 times daily., Disp: 3 Tube, Rfl: 3   escitalopram (LEXAPRO) 10 MG tablet, Take 10 mg  daily by mouth., Disp: , Rfl: 0   estradiol (ESTRACE) 0.1 MG/GM vaginal cream, Place 1 Applicatorful vaginally at bedtime., Disp: , Rfl:    fexofenadine (ALLEGRA) 180 MG tablet, Take 180 mg by mouth daily., Disp: , Rfl:    FIBER PO, Take by mouth., Disp: , Rfl:    fluticasone (FLONASE) 50 MCG/ACT nasal spray, Place into the nose as needed. , Disp: , Rfl:    hydroxychloroquine (PLAQUENIL) 200 MG tablet, Take 200 mg by mouth 2 (two) times daily., Disp: , Rfl:    ibuprofen (ADVIL,MOTRIN) 200 MG tablet, Take by mouth as needed. , Disp: , Rfl:    Lactase 9000 units CHEW, Chew by mouth as needed. , Disp: , Rfl:    Probiotic Product (ALIGN) 10 MG CAPS, 1 capsule Orally once a day, Disp: , Rfl:    tiZANidine (ZANAFLEX) 2 MG tablet, Take 2-4 mg by mouth 3 (three) times daily as needed., Disp: , Rfl:    ASSESSMENT AND PLAN: .      ICD-10-CM   1. Aneurysm of ascending aorta without rupture (HCC)  I71.21 EKG 12-Lead    ECHOCARDIOGRAM COMPLETE    2. Marfanoid habitus  R29.91     3. Hypercholesteremia  E78.00       No orders of the defined types were placed in this encounter.   Medications Discontinued During This Encounter  Medication Reason   diclofenac (VOLTAREN) 75 MG EC tablet Patient Preference   methotrexate (RHEUMATREX) 2.5 MG tablet Patient Preference    Assessment and Plan Ascending aortic aneurysm   The ascending aortic aneurysm measures 4.5 cm, consistent with previous measurements. Surgical intervention is considered at 5.5 cm. Given its  stability over the past year and concerns about radiation exposure, especially in females, an echocardiogram will assess the aneurysm size and correlate with CT findings. If echocardiogram visualization is adequate, yearly echocardiograms will be preferred to minimize radiation. CT imaging will be considered if echocardiogram visualization is inadequate.  Hypercholesterolemia   LDL cholesterol increased from 123 mg/dL in 0454 to 098 mg/dL in 1191. No coronary calcification or aortic atherosclerosis on CT. She is not diabetic, a nonsmoker, and prefers to avoid cholesterol medication. Monitoring is planned given the absence of significant risk factors and her preference. LDL cholesterol levels will be monitored annually, and current lifestyle and dietary modifications will continue.  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 No indication for genetic testing in her children as features do not appear hereditary. No specific plan required at this time.     Signed,  Yates Decamp, MD, [facility] 11/28/2023, 7:46 AM [facility] [address] [place], [place] [address] Phone: [phone]. Fax:  [phone]

## 2023-11-26 NOTE — Patient Instructions (Signed)
 Medication Instructions:  Your physician recommends that you continue on your current medications as directed. Please refer to the Current Medication list given to you today.  *If you need a refill on your cardiac medications before your next appointment, please call your pharmacy*   Lab Work: none If you have labs (blood work) drawn today and your tests are completely normal, you will receive your results only by: MyChart Message (if you have MyChart) OR A paper copy in the mail If you have any lab test that is abnormal or we need to change your treatment, we will call you to review the results.   Testing/Procedures: Your physician has requested that you have an echocardiogram. Echocardiography is a painless test that uses sound waves to create images of your heart. It provides your doctor with information about the size and shape of your heart and how well your heart's chambers and valves are working. This procedure takes approximately one hour. There are no restrictions for this procedure. Please do NOT wear cologne, perfume, aftershave, or lotions (deodorant is allowed). Please arrive 15 minutes prior to your appointment time.  Please note: We ask at that you not bring children with you during ultrasound (echo/ vascular) testing. Due to room size and safety concerns, children are not allowed in the ultrasound rooms during exams. Our front office staff cannot provide observation of children in our lobby area while testing is being conducted. An adult accompanying a patient to their appointment will only be allowed in the ultrasound room at the discretion of the ultrasound technician under special circumstances. We apologize for any inconvenience.    Follow-Up: At [facility], you and your health needs are our priority.  As part of our continuing mission to provide you with exceptional heart care, we have created designated Provider Care Teams.  These Care Teams include your  primary Cardiologist (physician) and Advanced Practice Providers (APPs -  Physician Assistants and Nurse Practitioners) who all work together to provide you with the care you need, when you need it.  We recommend signing up for the patient portal called "MyChart".  Sign up information is provided on this After Visit Summary.  MyChart is used to connect with patients for Virtual Visits (Telemedicine).  Patients are able to view lab/test results, encounter notes, upcoming appointments, etc.  Non-urgent messages can be sent to your provider as well.   To learn more about what you can do with MyChart, go to [email or web address].    Your next appointment:   12 month(s)  Provider:   Yates Decamp, MD     Other Instructions

## 2023-12-10 DIAGNOSIS — M79641 Pain in right hand: Secondary | ICD-10-CM | POA: Diagnosis not present

## 2023-12-10 DIAGNOSIS — Z79899 Other long term (current) drug therapy: Secondary | ICD-10-CM | POA: Diagnosis not present

## 2023-12-10 DIAGNOSIS — M79671 Pain in right foot: Secondary | ICD-10-CM | POA: Diagnosis not present

## 2023-12-10 DIAGNOSIS — M79672 Pain in left foot: Secondary | ICD-10-CM | POA: Diagnosis not present

## 2023-12-10 DIAGNOSIS — M1990 Unspecified osteoarthritis, unspecified site: Secondary | ICD-10-CM | POA: Diagnosis not present

## 2023-12-10 DIAGNOSIS — M79642 Pain in left hand: Secondary | ICD-10-CM | POA: Diagnosis not present

## 2023-12-10 DIAGNOSIS — M797 Fibromyalgia: Secondary | ICD-10-CM | POA: Diagnosis not present

## 2023-12-10 DIAGNOSIS — M0609 Rheumatoid arthritis without rheumatoid factor, multiple sites: Secondary | ICD-10-CM | POA: Diagnosis not present

## 2023-12-23 ENCOUNTER — Encounter: Payer: Self-pay | Admitting: Cardiology

## 2023-12-23 ENCOUNTER — Ambulatory Visit (HOSPITAL_COMMUNITY): Attending: Cardiology

## 2023-12-23 DIAGNOSIS — I7121 Aneurysm of the ascending aorta, without rupture: Secondary | ICD-10-CM | POA: Diagnosis not present

## 2023-12-23 LAB — ECHOCARDIOGRAM COMPLETE
Area-P 1/2: 2.38 cm2
S' Lateral: 3 cm

## 2023-12-23 NOTE — Progress Notes (Signed)
 No good correlation of the echo and CTA findings with regards to ascending aortic aneurysm, CT 4.5 and echo 3.8. Hence will need yearly or every other year CT chest.

## 2024-01-15 ENCOUNTER — Telehealth: Payer: Self-pay | Admitting: Cardiology

## 2024-01-15 NOTE — Telephone Encounter (Signed)
 Patient called to follow-up on her echocardiogram test results.  Patient stated can leave VM.

## 2024-01-15 NOTE — Telephone Encounter (Signed)
 Joann Perl, MD t No good correlation of the echo and CTA findings with regards to ascending aortic aneurysm, CT 4.5 and echo 3.8. Hence will need yearly or every other year CT chest.   The patient has been notified of the result and verbalized understanding.  All questions (if any) were answered. Edith Groleau Chauvigne, RN 01/15/2024 11:26 AM

## 2024-01-21 NOTE — Telephone Encounter (Signed)
 Message from Dr Berry Bristol- Yes needs CT angio chest for aortic aneurysm in a year prior to her visit.  Order placed

## 2024-02-15 DIAGNOSIS — Z79899 Other long term (current) drug therapy: Secondary | ICD-10-CM | POA: Diagnosis not present

## 2024-02-15 DIAGNOSIS — H52203 Unspecified astigmatism, bilateral: Secondary | ICD-10-CM | POA: Diagnosis not present

## 2024-02-18 DIAGNOSIS — R1013 Epigastric pain: Secondary | ICD-10-CM | POA: Diagnosis not present

## 2024-02-18 DIAGNOSIS — R42 Dizziness and giddiness: Secondary | ICD-10-CM | POA: Diagnosis not present

## 2024-02-18 DIAGNOSIS — R110 Nausea: Secondary | ICD-10-CM | POA: Diagnosis not present

## 2024-03-30 DIAGNOSIS — F331 Major depressive disorder, recurrent, moderate: Secondary | ICD-10-CM | POA: Diagnosis not present

## 2024-03-30 DIAGNOSIS — E559 Vitamin D deficiency, unspecified: Secondary | ICD-10-CM | POA: Diagnosis not present

## 2024-03-30 DIAGNOSIS — Z0000 Encounter for general adult medical examination without abnormal findings: Secondary | ICD-10-CM | POA: Diagnosis not present

## 2024-04-04 ENCOUNTER — Encounter: Payer: Self-pay | Admitting: Family Medicine

## 2024-04-06 ENCOUNTER — Other Ambulatory Visit: Payer: Self-pay | Admitting: Family Medicine

## 2024-04-06 DIAGNOSIS — Q2543 Congenital aneurysm of aorta: Secondary | ICD-10-CM

## 2024-04-06 DIAGNOSIS — R9389 Abnormal findings on diagnostic imaging of other specified body structures: Secondary | ICD-10-CM

## 2024-04-08 ENCOUNTER — Other Ambulatory Visit: Payer: Self-pay | Admitting: Obstetrics

## 2024-04-08 DIAGNOSIS — Z1231 Encounter for screening mammogram for malignant neoplasm of breast: Secondary | ICD-10-CM

## 2024-04-18 DIAGNOSIS — F4321 Adjustment disorder with depressed mood: Secondary | ICD-10-CM | POA: Diagnosis not present

## 2024-04-19 ENCOUNTER — Other Ambulatory Visit

## 2024-04-20 DIAGNOSIS — M1990 Unspecified osteoarthritis, unspecified site: Secondary | ICD-10-CM | POA: Diagnosis not present

## 2024-04-20 DIAGNOSIS — Z79899 Other long term (current) drug therapy: Secondary | ICD-10-CM | POA: Diagnosis not present

## 2024-04-20 DIAGNOSIS — M797 Fibromyalgia: Secondary | ICD-10-CM | POA: Diagnosis not present

## 2024-04-20 DIAGNOSIS — M0609 Rheumatoid arthritis without rheumatoid factor, multiple sites: Secondary | ICD-10-CM | POA: Diagnosis not present

## 2024-04-29 ENCOUNTER — Ambulatory Visit
Admission: RE | Admit: 2024-04-29 | Discharge: 2024-04-29 | Disposition: A | Discharge status: Home / Self Care | Source: Ambulatory Visit | Attending: Family Medicine | Admitting: Family Medicine

## 2024-04-29 DIAGNOSIS — I7121 Aneurysm of the ascending aorta, without rupture: Secondary | ICD-10-CM | POA: Diagnosis not present

## 2024-04-29 DIAGNOSIS — R9389 Abnormal findings on diagnostic imaging of other specified body structures: Secondary | ICD-10-CM

## 2024-04-29 DIAGNOSIS — Q2543 Congenital aneurysm of aorta: Secondary | ICD-10-CM

## 2024-04-29 MED ORDER — IOPAMIDOL (ISOVUE-370) INJECTION 76%
100.0000 mL | Freq: Once | INTRAVENOUS | Status: AC | PRN
  Administered 2024-04-29: 100 mL via INTRAVENOUS

## 2024-05-02 DIAGNOSIS — F4321 Adjustment disorder with depressed mood: Secondary | ICD-10-CM | POA: Diagnosis not present

## 2024-05-25 DIAGNOSIS — F4321 Adjustment disorder with depressed mood: Secondary | ICD-10-CM | POA: Diagnosis not present

## 2024-06-02 ENCOUNTER — Telehealth: Payer: Self-pay

## 2024-06-02 DIAGNOSIS — M722 Plantar fascial fibromatosis: Secondary | ICD-10-CM

## 2024-06-02 NOTE — Telephone Encounter (Signed)
 Patient dropped off orthotics to be refurbished. Patient paid $90 at drop off. Charges entered.

## 2024-06-07 ENCOUNTER — Ambulatory Visit
Admission: RE | Admit: 2024-06-07 | Discharge: 2024-06-07 | Disposition: A | Discharge status: Home / Self Care | Source: Ambulatory Visit | Attending: Obstetrics | Admitting: Obstetrics

## 2024-06-07 DIAGNOSIS — Z1231 Encounter for screening mammogram for malignant neoplasm of breast: Secondary | ICD-10-CM

## 2024-06-08 DIAGNOSIS — D2362 Other benign neoplasm of skin of left upper limb, including shoulder: Secondary | ICD-10-CM | POA: Diagnosis not present

## 2024-06-08 DIAGNOSIS — L814 Other melanin hyperpigmentation: Secondary | ICD-10-CM | POA: Diagnosis not present

## 2024-06-08 DIAGNOSIS — L570 Actinic keratosis: Secondary | ICD-10-CM | POA: Diagnosis not present

## 2024-06-15 DIAGNOSIS — Z01419 Encounter for gynecological examination (general) (routine) without abnormal findings: Secondary | ICD-10-CM | POA: Diagnosis not present

## 2024-06-15 DIAGNOSIS — Z87898 Personal history of other specified conditions: Secondary | ICD-10-CM | POA: Diagnosis not present

## 2024-06-22 DIAGNOSIS — F4321 Adjustment disorder with depressed mood: Secondary | ICD-10-CM | POA: Diagnosis not present

## 2024-07-01 ENCOUNTER — Telehealth: Payer: Self-pay | Admitting: Podiatry

## 2024-07-01 NOTE — Telephone Encounter (Signed)
 The patient dropped off her inserts on 06/02/24 and is checking on the status. She states that she has left several messages with no response  to the message(s) Could you please call her back at your earliest convenience with an update?  Thank you,

## 2024-07-18 DIAGNOSIS — M722 Plantar fascial fibromatosis: Secondary | ICD-10-CM

## 2024-07-28 DIAGNOSIS — Z23 Encounter for immunization: Secondary | ICD-10-CM | POA: Diagnosis not present

## 2024-07-29 ENCOUNTER — Telehealth: Payer: Self-pay

## 2024-07-29 NOTE — Telephone Encounter (Signed)
 Orthotics sent to [facility] for refurbish patient Pd in full  Ok to [employer]

## 2024-08-10 DIAGNOSIS — F4321 Adjustment disorder with depressed mood: Secondary | ICD-10-CM | POA: Diagnosis not present

## 2024-08-25 ENCOUNTER — Telehealth: Payer: Self-pay

## 2024-08-25 NOTE — Telephone Encounter (Signed)
 Patient's Refurbished Orthotics are in.

## 2024-09-06 NOTE — Telephone Encounter (Signed)
 Spoke with Joann Smith to let her know the refurbished orthotics are in the office and ready for pickup. She will be by this week.
